# Patient Record
Sex: Female | Born: 1935 | Race: White | Hispanic: No
Health system: Southern US, Community
[De-identification: ages and names within clinical notes are randomized; demographics above are authoritative.]

---

## 2004-03-30 ENCOUNTER — Other Ambulatory Visit: Payer: Self-pay

## 2005-03-02 ENCOUNTER — Ambulatory Visit: Payer: Self-pay | Admitting: Internal Medicine

## 2006-03-22 ENCOUNTER — Ambulatory Visit: Payer: Self-pay | Admitting: Internal Medicine

## 2006-06-11 ENCOUNTER — Ambulatory Visit: Payer: Self-pay | Admitting: Ophthalmology

## 2006-09-18 ENCOUNTER — Emergency Department: Payer: Self-pay | Admitting: Emergency Medicine

## 2007-03-26 ENCOUNTER — Ambulatory Visit: Payer: Self-pay | Admitting: Internal Medicine

## 2007-04-18 ENCOUNTER — Other Ambulatory Visit: Payer: Self-pay

## 2007-04-18 ENCOUNTER — Emergency Department: Payer: Self-pay | Admitting: Emergency Medicine

## 2008-02-17 ENCOUNTER — Ambulatory Visit: Payer: Self-pay | Admitting: Internal Medicine

## 2008-02-18 ENCOUNTER — Ambulatory Visit: Payer: Self-pay | Admitting: Internal Medicine

## 2008-02-25 ENCOUNTER — Ambulatory Visit: Payer: Self-pay | Admitting: Internal Medicine

## 2008-03-18 ENCOUNTER — Ambulatory Visit: Payer: Self-pay | Admitting: Internal Medicine

## 2008-04-18 ENCOUNTER — Ambulatory Visit: Payer: Self-pay | Admitting: Internal Medicine

## 2008-05-19 ENCOUNTER — Ambulatory Visit: Payer: Self-pay | Admitting: Internal Medicine

## 2008-05-28 ENCOUNTER — Ambulatory Visit: Payer: Self-pay | Admitting: Internal Medicine

## 2008-06-18 ENCOUNTER — Ambulatory Visit: Payer: Self-pay | Admitting: Internal Medicine

## 2008-07-19 ENCOUNTER — Ambulatory Visit: Payer: Self-pay | Admitting: Internal Medicine

## 2008-08-18 ENCOUNTER — Ambulatory Visit: Payer: Self-pay | Admitting: Internal Medicine

## 2008-09-18 ENCOUNTER — Ambulatory Visit: Payer: Self-pay | Admitting: Internal Medicine

## 2008-10-19 ENCOUNTER — Ambulatory Visit: Payer: Self-pay | Admitting: Internal Medicine

## 2008-11-16 ENCOUNTER — Ambulatory Visit: Payer: Self-pay | Admitting: Internal Medicine

## 2008-12-17 ENCOUNTER — Ambulatory Visit: Payer: Self-pay | Admitting: Internal Medicine

## 2008-12-25 ENCOUNTER — Ambulatory Visit: Payer: Self-pay | Admitting: Internal Medicine

## 2009-01-16 ENCOUNTER — Ambulatory Visit: Payer: Self-pay | Admitting: Internal Medicine

## 2009-02-16 ENCOUNTER — Ambulatory Visit: Payer: Self-pay | Admitting: Internal Medicine

## 2009-03-18 ENCOUNTER — Ambulatory Visit: Payer: Self-pay | Admitting: Internal Medicine

## 2009-04-07 ENCOUNTER — Ambulatory Visit: Payer: Self-pay | Admitting: Internal Medicine

## 2009-04-18 ENCOUNTER — Ambulatory Visit: Payer: Self-pay | Admitting: Internal Medicine

## 2009-05-19 ENCOUNTER — Ambulatory Visit: Payer: Self-pay | Admitting: Internal Medicine

## 2009-06-18 ENCOUNTER — Ambulatory Visit: Payer: Self-pay | Admitting: Internal Medicine

## 2009-07-19 ENCOUNTER — Ambulatory Visit: Payer: Self-pay | Admitting: Internal Medicine

## 2009-08-18 ENCOUNTER — Ambulatory Visit: Payer: Self-pay | Admitting: Internal Medicine

## 2009-09-18 ENCOUNTER — Ambulatory Visit: Payer: Self-pay | Admitting: Internal Medicine

## 2009-10-12 ENCOUNTER — Ambulatory Visit: Payer: Self-pay | Admitting: Internal Medicine

## 2009-10-19 ENCOUNTER — Ambulatory Visit: Payer: Self-pay | Admitting: Internal Medicine

## 2009-11-16 ENCOUNTER — Ambulatory Visit: Payer: Self-pay | Admitting: Internal Medicine

## 2009-12-16 ENCOUNTER — Ambulatory Visit: Payer: Self-pay | Admitting: Internal Medicine

## 2009-12-17 ENCOUNTER — Ambulatory Visit: Payer: Self-pay | Admitting: Internal Medicine

## 2010-02-17 ENCOUNTER — Ambulatory Visit: Payer: Self-pay | Admitting: Internal Medicine

## 2010-03-18 ENCOUNTER — Ambulatory Visit: Payer: Self-pay | Admitting: Internal Medicine

## 2010-06-02 ENCOUNTER — Ambulatory Visit: Payer: Self-pay | Admitting: Internal Medicine

## 2010-06-18 ENCOUNTER — Ambulatory Visit: Payer: Self-pay | Admitting: Internal Medicine

## 2010-11-22 ENCOUNTER — Ambulatory Visit: Payer: Self-pay | Admitting: Internal Medicine

## 2010-12-18 ENCOUNTER — Ambulatory Visit: Payer: Self-pay | Admitting: Internal Medicine

## 2011-02-22 ENCOUNTER — Ambulatory Visit: Payer: Self-pay | Admitting: Internal Medicine

## 2011-03-19 ENCOUNTER — Ambulatory Visit: Payer: Self-pay | Admitting: Internal Medicine

## 2011-05-30 ENCOUNTER — Ambulatory Visit: Payer: Self-pay | Admitting: Internal Medicine

## 2011-06-19 ENCOUNTER — Ambulatory Visit: Payer: Self-pay | Admitting: Internal Medicine

## 2011-11-28 ENCOUNTER — Ambulatory Visit: Payer: Self-pay | Admitting: Oncology

## 2011-11-28 LAB — CBC CANCER CENTER
Basophil #: 0.1 x10 3/mm (ref 0.0–0.1)
Basophil %: 0.9 %
Eosinophil #: 0.5 x10 3/mm (ref 0.0–0.7)
HCT: 32.1 % — ABNORMAL LOW (ref 35.0–47.0)
MCH: 31.8 pg (ref 26.0–34.0)
MCHC: 34 g/dL (ref 32.0–36.0)
MCV: 94 fL (ref 80–100)
Monocyte #: 0.7 x10 3/mm (ref 0.0–0.7)
Monocyte %: 8.7 %
Neutrophil %: 62 %
Platelet: 294 x10 3/mm (ref 150–440)
RDW: 14 % (ref 11.5–14.5)
WBC: 7.5 x10 3/mm (ref 3.6–11.0)

## 2011-11-29 LAB — PROT IMMUNOELECTROPHORES(ARMC)

## 2011-12-18 ENCOUNTER — Ambulatory Visit: Payer: Self-pay | Admitting: Oncology

## 2013-02-16 ENCOUNTER — Ambulatory Visit: Payer: Self-pay | Admitting: Oncology

## 2013-03-03 LAB — CBC
HCT: 29.1 % — ABNORMAL LOW (ref 35.0–47.0)
HGB: 9.9 g/dL — ABNORMAL LOW (ref 12.0–16.0)
MCH: 32 pg (ref 26.0–34.0)
MCV: 94 fL (ref 80–100)
Platelet: 260 10*3/uL (ref 150–440)
RBC: 3.09 10*6/uL — ABNORMAL LOW (ref 3.80–5.20)
RDW: 14.2 % (ref 11.5–14.5)
WBC: 6.1 10*3/uL (ref 3.6–11.0)

## 2013-03-03 LAB — URINALYSIS, COMPLETE
Glucose,UR: NEGATIVE mg/dL (ref 0–75)
Hyaline Cast: 4
Nitrite: NEGATIVE
Ph: 5 (ref 4.5–8.0)
Protein: 30
RBC,UR: 4 /HPF (ref 0–5)
Specific Gravity: 1.016 (ref 1.003–1.030)
WBC UR: 1 /HPF (ref 0–5)

## 2013-03-03 LAB — COMPREHENSIVE METABOLIC PANEL
Bilirubin,Total: 0.6 mg/dL (ref 0.2–1.0)
Co2: 24 mmol/L (ref 21–32)
EGFR (African American): 17 — ABNORMAL LOW
EGFR (Non-African Amer.): 14 — ABNORMAL LOW
Glucose: 139 mg/dL — ABNORMAL HIGH (ref 65–99)
Osmolality: 285 (ref 275–301)
Potassium: 4.6 mmol/L (ref 3.5–5.1)
SGPT (ALT): 16 U/L (ref 12–78)
Total Protein: 6.8 g/dL (ref 6.4–8.2)

## 2013-03-03 LAB — CK TOTAL AND CKMB (NOT AT ARMC)
CK, Total: 124 U/L (ref 21–215)
CK-MB: 0.9 ng/mL (ref 0.5–3.6)

## 2013-03-03 LAB — TSH: Thyroid Stimulating Horm: 10.4 u[IU]/mL — ABNORMAL HIGH

## 2013-03-04 ENCOUNTER — Inpatient Hospital Stay: Payer: Self-pay | Admitting: Specialist

## 2013-03-04 LAB — IRON AND TIBC
Iron Bind.Cap.(Total): 186 ug/dL — ABNORMAL LOW (ref 250–450)
Iron Saturation: 34 %
Iron: 63 ug/dL (ref 50–170)

## 2013-03-04 LAB — CBC WITH DIFFERENTIAL/PLATELET
Basophil %: 1.8 %
Lymphocyte #: 1.3 10*3/uL (ref 1.0–3.6)
MCH: 31.5 pg (ref 26.0–34.0)
MCV: 93 fL (ref 80–100)
Monocyte %: 11.1 %
Neutrophil %: 56.8 %
Platelet: 223 10*3/uL (ref 150–440)
RDW: 13.8 % (ref 11.5–14.5)
WBC: 6 10*3/uL (ref 3.6–11.0)

## 2013-03-04 LAB — CK TOTAL AND CKMB (NOT AT ARMC)
CK, Total: 99 U/L (ref 21–215)
CK-MB: 1.2 ng/mL (ref 0.5–3.6)

## 2013-03-04 LAB — BASIC METABOLIC PANEL
Anion Gap: 4 — ABNORMAL LOW (ref 7–16)
Calcium, Total: 8.5 mg/dL (ref 8.5–10.1)
Chloride: 113 mmol/L — ABNORMAL HIGH (ref 98–107)
EGFR (African American): 20 — ABNORMAL LOW
Glucose: 81 mg/dL (ref 65–99)
Osmolality: 291 (ref 275–301)
Sodium: 143 mmol/L (ref 136–145)

## 2013-03-04 LAB — TROPONIN I: Troponin-I: <0.02 ng/mL

## 2013-03-04 LAB — TSH: Thyroid Stimulating Horm: 6.46 u[IU]/mL — ABNORMAL HIGH

## 2013-03-04 LAB — FERRITIN: Ferritin (ARMC): 280 ng/mL (ref 8–388)

## 2013-03-04 LAB — T4, FREE: Free Thyroxine: 1.11 ng/dL (ref 0.76–1.46)

## 2013-03-05 LAB — PHOSPHORUS: Phosphorus: 2.5 mg/dL (ref 2.5–4.9)

## 2013-03-05 LAB — CBC WITH DIFFERENTIAL/PLATELET
Basophil #: 0.1 10*3/uL (ref 0.0–0.1)
HCT: 23.9 % — ABNORMAL LOW (ref 35.0–47.0)
Lymphocyte #: 1.1 10*3/uL (ref 1.0–3.6)
MCHC: 34.2 g/dL (ref 32.0–36.0)
Platelet: 200 10*3/uL (ref 150–440)
RBC: 2.57 10*6/uL — ABNORMAL LOW (ref 3.80–5.20)
WBC: 7.5 10*3/uL (ref 3.6–11.0)

## 2013-03-05 LAB — BASIC METABOLIC PANEL
Anion Gap: 5 — ABNORMAL LOW (ref 7–16)
BUN: 30 mg/dL — ABNORMAL HIGH (ref 7–18)
Calcium, Total: 8 mg/dL — ABNORMAL LOW (ref 8.5–10.1)
Co2: 22 mmol/L (ref 21–32)
EGFR (African American): 28 — ABNORMAL LOW
EGFR (Non-African Amer.): 24 — ABNORMAL LOW
Glucose: 83 mg/dL (ref 65–99)
Osmolality: 294 (ref 275–301)
Potassium: 5.1 mmol/L (ref 3.5–5.1)
Sodium: 145 mmol/L (ref 136–145)

## 2013-03-05 LAB — CK: CK, Total: 107 U/L (ref 21–215)

## 2013-03-05 LAB — LIPID PANEL
HDL Cholesterol: 55 mg/dL (ref 40–60)
Ldl Cholesterol, Calc: 119 mg/dL — ABNORMAL HIGH (ref 0–100)
VLDL Cholesterol, Calc: 18 mg/dL (ref 5–40)

## 2013-03-05 LAB — HEMOGLOBIN A1C: Hemoglobin A1C: 4.9 % (ref 4.2–6.3)

## 2013-03-05 LAB — WBCS, STOOL

## 2013-03-05 LAB — OCCULT BLOOD X 1 CARD TO LAB, STOOL: Occult Blood, Feces: NEGATIVE

## 2013-03-05 LAB — MAGNESIUM: Magnesium: 2 mg/dL

## 2013-03-05 LAB — CLOSTRIDIUM DIFFICILE BY PCR

## 2013-03-06 LAB — PROTEIN ELECTROPHORESIS(ARMC)

## 2013-03-06 LAB — PROTEIN / CREATININE RATIO, URINE
Creatinine, Urine: 71.3 mg/dL (ref 30.0–125.0)
Protein, Random Urine: 12 mg/dL (ref 0–12)
Protein/Creat. Ratio: 168 mg/gCREAT (ref 0–200)

## 2013-03-06 LAB — BASIC METABOLIC PANEL
Anion Gap: 7 (ref 7–16)
Calcium, Total: 8.1 mg/dL — ABNORMAL LOW (ref 8.5–10.1)
Chloride: 117 mmol/L — ABNORMAL HIGH (ref 98–107)
Co2: 21 mmol/L (ref 21–32)
Creatinine: 1.7 mg/dL — ABNORMAL HIGH (ref 0.60–1.30)
Glucose: 85 mg/dL (ref 65–99)
Osmolality: 293 (ref 275–301)
Sodium: 145 mmol/L (ref 136–145)

## 2013-03-06 LAB — HEMOGLOBIN: HGB: 8.6 g/dL — ABNORMAL LOW (ref 12.0–16.0)

## 2013-03-07 LAB — STOOL CULTURE

## 2013-03-07 LAB — BASIC METABOLIC PANEL
BUN: 26 mg/dL — ABNORMAL HIGH (ref 7–18)
Calcium, Total: 8.3 mg/dL — ABNORMAL LOW (ref 8.5–10.1)
EGFR (African American): 35 — ABNORMAL LOW
Potassium: 4.5 mmol/L (ref 3.5–5.1)

## 2013-03-08 LAB — CBC WITH DIFFERENTIAL/PLATELET
Eosinophil #: 0.8 10*3/uL — ABNORMAL HIGH (ref 0.0–0.7)
Eosinophil %: 13.3 %
HCT: 24.8 % — ABNORMAL LOW (ref 35.0–47.0)
Lymphocyte %: 21.4 %
MCH: 31.1 pg (ref 26.0–34.0)
MCHC: 33.4 g/dL (ref 32.0–36.0)
MCV: 93 fL (ref 80–100)
Monocyte %: 12.7 %
Neutrophil #: 3.2 10*3/uL (ref 1.4–6.5)
Neutrophil %: 51.2 %
Platelet: 223 10*3/uL (ref 150–440)
RBC: 2.66 10*6/uL — ABNORMAL LOW (ref 3.80–5.20)
WBC: 6.2 10*3/uL (ref 3.6–11.0)

## 2013-03-08 LAB — BASIC METABOLIC PANEL
Anion Gap: 6 — ABNORMAL LOW (ref 7–16)
Calcium, Total: 8.1 mg/dL — ABNORMAL LOW (ref 8.5–10.1)
Chloride: 117 mmol/L — ABNORMAL HIGH (ref 98–107)
Co2: 21 mmol/L (ref 21–32)
EGFR (Non-African Amer.): 27 — ABNORMAL LOW
Glucose: 80 mg/dL (ref 65–99)
Potassium: 4.3 mmol/L (ref 3.5–5.1)

## 2013-03-09 LAB — BASIC METABOLIC PANEL
Anion Gap: 8 (ref 7–16)
Chloride: 117 mmol/L — ABNORMAL HIGH (ref 98–107)
Co2: 21 mmol/L (ref 21–32)
Creatinine: 1.7 mg/dL — ABNORMAL HIGH (ref 0.60–1.30)
EGFR (Non-African Amer.): 26 — ABNORMAL LOW
Glucose: 78 mg/dL (ref 65–99)
Osmolality: 293 (ref 275–301)
Potassium: 4.4 mmol/L (ref 3.5–5.1)

## 2013-03-10 LAB — BASIC METABOLIC PANEL
Anion Gap: 8 (ref 7–16)
BUN: 20 mg/dL — ABNORMAL HIGH (ref 7–18)
Co2: 21 mmol/L (ref 21–32)
Creatinine: 1.51 mg/dL — ABNORMAL HIGH (ref 0.60–1.30)
EGFR (African American): 35 — ABNORMAL LOW
EGFR (Non-African Amer.): 31 — ABNORMAL LOW
Osmolality: 290 (ref 275–301)
Sodium: 145 mmol/L (ref 136–145)

## 2013-03-18 ENCOUNTER — Ambulatory Visit: Payer: Self-pay | Admitting: Oncology

## 2013-03-29 ENCOUNTER — Emergency Department: Payer: Self-pay | Admitting: Emergency Medicine

## 2013-03-29 LAB — CBC
HCT: 34.1 % — ABNORMAL LOW (ref 35.0–47.0)
MCV: 96 fL (ref 80–100)
RBC: 3.54 10*6/uL — ABNORMAL LOW (ref 3.80–5.20)
RDW: 15.9 % — ABNORMAL HIGH (ref 11.5–14.5)

## 2013-03-29 LAB — COMPREHENSIVE METABOLIC PANEL
BUN: 33 mg/dL — ABNORMAL HIGH (ref 7–18)
Bilirubin,Total: 0.6 mg/dL (ref 0.2–1.0)
Calcium, Total: 9.1 mg/dL (ref 8.5–10.1)
Chloride: 107 mmol/L (ref 98–107)
Creatinine: 1.69 mg/dL — ABNORMAL HIGH (ref 0.60–1.30)
EGFR (African American): 31 — ABNORMAL LOW
EGFR (Non-African Amer.): 27 — ABNORMAL LOW
Osmolality: 284 (ref 275–301)
Potassium: 4.6 mmol/L (ref 3.5–5.1)
SGOT(AST): 28 U/L (ref 15–37)
SGPT (ALT): 23 U/L (ref 12–78)
Sodium: 139 mmol/L (ref 136–145)
Total Protein: 7 g/dL (ref 6.4–8.2)

## 2013-03-29 LAB — URINALYSIS, COMPLETE
Bilirubin,UR: NEGATIVE
Blood: NEGATIVE
Glucose,UR: NEGATIVE mg/dL (ref 0–75)
Ketone: NEGATIVE
Leukocyte Esterase: NEGATIVE
Ph: 6 (ref 4.5–8.0)
RBC,UR: <1 /HPF (ref 0–5)
Specific Gravity: 1.014 (ref 1.003–1.030)
Squamous Epithelial: NONE SEEN
WBC UR: <1 /HPF (ref 0–5)

## 2013-04-01 ENCOUNTER — Inpatient Hospital Stay: Payer: Self-pay | Admitting: Internal Medicine

## 2013-04-01 LAB — URINALYSIS, COMPLETE
Ketone: NEGATIVE
Leukocyte Esterase: NEGATIVE
Ph: 5 (ref 4.5–8.0)
Protein: 30
RBC,UR: <1 /HPF (ref 0–5)
Specific Gravity: 1.016 (ref 1.003–1.030)

## 2013-04-01 LAB — CBC
HCT: 33.9 % — ABNORMAL LOW (ref 35.0–47.0)
MCHC: 33.1 g/dL (ref 32.0–36.0)
MCV: 97 fL (ref 80–100)
RBC: 3.51 10*6/uL — ABNORMAL LOW (ref 3.80–5.20)
WBC: 8.1 10*3/uL (ref 3.6–11.0)

## 2013-04-01 LAB — COMPREHENSIVE METABOLIC PANEL
Alkaline Phosphatase: 169 U/L — ABNORMAL HIGH (ref 50–136)
Anion Gap: 11 (ref 7–16)
BUN: 37 mg/dL — ABNORMAL HIGH (ref 7–18)
Bilirubin,Total: 0.6 mg/dL (ref 0.2–1.0)
Chloride: 107 mmol/L (ref 98–107)
Creatinine: 1.71 mg/dL — ABNORMAL HIGH (ref 0.60–1.30)
EGFR (Non-African Amer.): 26 — ABNORMAL LOW
Osmolality: 290 (ref 275–301)
SGOT(AST): 42 U/L — ABNORMAL HIGH (ref 15–37)
SGPT (ALT): 35 U/L (ref 12–78)
Sodium: 141 mmol/L (ref 136–145)
Total Protein: 7.5 g/dL (ref 6.4–8.2)

## 2013-04-01 LAB — MAGNESIUM: Magnesium: 1.9 mg/dL

## 2013-04-01 LAB — CK TOTAL AND CKMB (NOT AT ARMC)
CK, Total: 85 U/L (ref 21–215)
CK-MB: <0.5 ng/mL — ABNORMAL LOW (ref 0.5–3.6)

## 2013-04-01 LAB — TROPONIN I: Troponin-I: <0.02 ng/mL

## 2013-04-01 LAB — T4, FREE: Free Thyroxine: 0.97 ng/dL (ref 0.76–1.46)

## 2013-04-02 LAB — COMPREHENSIVE METABOLIC PANEL
Alkaline Phosphatase: 138 U/L — ABNORMAL HIGH (ref 50–136)
BUN: 29 mg/dL — ABNORMAL HIGH (ref 7–18)
Bilirubin,Total: 0.5 mg/dL (ref 0.2–1.0)
Chloride: 113 mmol/L — ABNORMAL HIGH (ref 98–107)
Co2: 23 mmol/L (ref 21–32)
Creatinine: 1.43 mg/dL — ABNORMAL HIGH (ref 0.60–1.30)
EGFR (African American): 38 — ABNORMAL LOW
Osmolality: 288 (ref 275–301)
Potassium: 4 mmol/L (ref 3.5–5.1)
SGOT(AST): 24 U/L (ref 15–37)
SGPT (ALT): 25 U/L (ref 12–78)

## 2013-04-02 LAB — CBC WITH DIFFERENTIAL/PLATELET
Basophil #: 0.2 10*3/uL — ABNORMAL HIGH (ref 0.0–0.1)
Basophil %: 2.2 %
Eosinophil #: 1 10*3/uL — ABNORMAL HIGH (ref 0.0–0.7)
Eosinophil %: 13.4 %
HCT: 30.6 % — ABNORMAL LOW (ref 35.0–47.0)
HGB: 10.3 g/dL — ABNORMAL LOW (ref 12.0–16.0)
Lymphocyte #: 0.6 10*3/uL — ABNORMAL LOW (ref 1.0–3.6)
Lymphocyte %: 7.6 %
MCH: 32.4 pg (ref 26.0–34.0)
Monocyte #: 0.7 x10 3/mm (ref 0.2–0.9)
Neutrophil %: 67.6 %
RBC: 3.19 10*6/uL — ABNORMAL LOW (ref 3.80–5.20)
RDW: 15.6 % — ABNORMAL HIGH (ref 11.5–14.5)

## 2013-04-02 LAB — PROTIME-INR: INR: 1.1

## 2013-04-02 LAB — MAGNESIUM: Magnesium: 1.6 mg/dL — ABNORMAL LOW

## 2013-04-02 LAB — LIPASE, BLOOD: Lipase: 263 U/L (ref 73–393)

## 2013-04-06 LAB — CULTURE, BLOOD (SINGLE)

## 2013-04-18 ENCOUNTER — Ambulatory Visit: Payer: Self-pay | Admitting: Oncology

## 2013-04-29 DIAGNOSIS — S62109A Fracture of unspecified carpal bone, unspecified wrist, initial encounter for closed fracture: Secondary | ICD-10-CM

## 2013-04-29 DIAGNOSIS — F0150 Vascular dementia without behavioral disturbance: Secondary | ICD-10-CM

## 2013-04-29 DIAGNOSIS — F3289 Other specified depressive episodes: Secondary | ICD-10-CM

## 2013-04-29 DIAGNOSIS — F329 Major depressive disorder, single episode, unspecified: Secondary | ICD-10-CM

## 2013-04-29 DIAGNOSIS — F411 Generalized anxiety disorder: Secondary | ICD-10-CM

## 2013-05-14 DIAGNOSIS — F411 Generalized anxiety disorder: Secondary | ICD-10-CM

## 2013-05-19 ENCOUNTER — Ambulatory Visit: Payer: Self-pay | Admitting: Internal Medicine

## 2013-05-22 DIAGNOSIS — F411 Generalized anxiety disorder: Secondary | ICD-10-CM

## 2013-05-22 DIAGNOSIS — F0150 Vascular dementia without behavioral disturbance: Secondary | ICD-10-CM

## 2013-05-22 DIAGNOSIS — F323 Major depressive disorder, single episode, severe with psychotic features: Secondary | ICD-10-CM

## 2013-05-28 DIAGNOSIS — N184 Chronic kidney disease, stage 4 (severe): Secondary | ICD-10-CM

## 2013-05-28 DIAGNOSIS — I428 Other cardiomyopathies: Secondary | ICD-10-CM

## 2013-05-28 DIAGNOSIS — F0150 Vascular dementia without behavioral disturbance: Secondary | ICD-10-CM

## 2013-05-28 DIAGNOSIS — E43 Unspecified severe protein-calorie malnutrition: Secondary | ICD-10-CM

## 2013-05-29 LAB — URINALYSIS, COMPLETE
Bilirubin,UR: NEGATIVE
Blood: NEGATIVE
Glucose,UR: NEGATIVE mg/dL (ref 0–75)
Hyaline Cast: 3
Nitrite: NEGATIVE
Ph: 5 (ref 4.5–8.0)
Protein: 30
RBC,UR: 2 /HPF (ref 0–5)
Specific Gravity: 1.015 (ref 1.003–1.030)
Squamous Epithelial: NONE SEEN
WBC UR: <1 /HPF (ref 0–5)

## 2013-05-29 LAB — CBC
MCH: 32 pg (ref 26.0–34.0)
MCHC: 33.7 g/dL (ref 32.0–36.0)
MCV: 95 fL (ref 80–100)
Platelet: 246 10*3/uL (ref 150–440)
RBC: 2.93 10*6/uL — ABNORMAL LOW (ref 3.80–5.20)
WBC: 7.1 10*3/uL (ref 3.6–11.0)

## 2013-05-29 LAB — COMPREHENSIVE METABOLIC PANEL
Alkaline Phosphatase: 93 U/L (ref 50–136)
BUN: 54 mg/dL — ABNORMAL HIGH (ref 7–18)
Chloride: 107 mmol/L (ref 98–107)
Co2: 26 mmol/L (ref 21–32)
Creatinine: 1.53 mg/dL — ABNORMAL HIGH (ref 0.60–1.30)
EGFR (Non-African Amer.): 30 — ABNORMAL LOW
Glucose: 111 mg/dL — ABNORMAL HIGH (ref 65–99)
Osmolality: 291 (ref 275–301)
Potassium: 4.4 mmol/L (ref 3.5–5.1)
SGOT(AST): 24 U/L (ref 15–37)
SGPT (ALT): 21 U/L (ref 12–78)

## 2013-05-30 ENCOUNTER — Inpatient Hospital Stay: Payer: Self-pay | Admitting: Specialist

## 2013-05-30 ENCOUNTER — Telehealth: Payer: Self-pay | Admitting: Family Medicine

## 2013-05-30 LAB — CBC WITH DIFFERENTIAL/PLATELET
Basophil #: 0 10*3/uL (ref 0.0–0.1)
Basophil %: 0.6 %
Eosinophil #: 0 10*3/uL (ref 0.0–0.7)
Eosinophil %: 0.3 %
Eosinophil %: 0 %
HGB: 7.7 g/dL — ABNORMAL LOW (ref 12.0–16.0)
HGB: 8.8 g/dL — ABNORMAL LOW (ref 12.0–16.0)
Lymphocyte #: 0.5 10*3/uL — ABNORMAL LOW (ref 1.0–3.6)
MCH: 31.1 pg (ref 26.0–34.0)
MCHC: 33 g/dL (ref 32.0–36.0)
MCHC: 33.2 g/dL (ref 32.0–36.0)
MCV: 95 fL (ref 80–100)
MCV: 94 fL (ref 80–100)
Monocyte %: 7.1 %
Neutrophil #: 13 10*3/uL — ABNORMAL HIGH (ref 1.4–6.5)
Neutrophil %: 88.4 %
Platelet: 255 10*3/uL (ref 150–440)
Platelet: 176 10*3/uL (ref 150–440)
RBC: 2.44 10*6/uL — ABNORMAL LOW (ref 3.80–5.20)
RBC: 2.83 10*6/uL — ABNORMAL LOW (ref 3.80–5.20)
WBC: 15.9 10*3/uL — ABNORMAL HIGH (ref 3.6–11.0)
WBC: 14.5 10*3/uL — ABNORMAL HIGH (ref 3.6–11.0)

## 2013-05-30 LAB — BASIC METABOLIC PANEL
Anion Gap: 6 — ABNORMAL LOW (ref 7–16)
Co2: 25 mmol/L (ref 21–32)
EGFR (Non-African Amer.): 28 — ABNORMAL LOW
Glucose: 197 mg/dL — ABNORMAL HIGH (ref 65–99)
Osmolality: 298 (ref 275–301)
Potassium: 4.6 mmol/L (ref 3.5–5.1)
Sodium: 139 mmol/L (ref 136–145)

## 2013-05-30 LAB — PROTIME-INR
INR: 1.1
Prothrombin Time: 14.2 secs (ref 11.5–14.7)

## 2013-05-30 LAB — APTT: Activated PTT: 29.8 secs (ref 23.6–35.9)

## 2013-05-30 NOTE — Telephone Encounter (Signed)
Will await word from the hospital--sounds like a hip fracture

## 2013-05-30 NOTE — Telephone Encounter (Signed)
[facility] Triage Call Report Triage Record Num: [id number] Operator: Chevis Pretty Patient Name: Hannah Baker Call Date & Time: 05/29/2013 8:47:25PM Patient Phone: [phone] PCP: Patient Gender: Female PCP Fax : Patient DOB: [birth year] Practice Name: [facility] Reason for Call: Caller: Tom/RN Shona Simpson; PCP: Tillman Abide [facility]); CB#: [phone]; Calling emergently regarding Report Fall. Fall occurred 05/29/13 and patient fell to the ground on her left side. States the left hip appears abducted and patient cannot move it. Per falls protocol, advised 911; facility to call family. Info to office. Protocol(s) Used: Falls Recommended Outcome per Protocol: Activate EMS 911 Reason for Outcome: Immobile since injury Care Advice: ~ Do not give the patient anything to eat or drink. ~ IMMEDIATE ACTION Write down provider's name. List or place the following in a bag for transport with the patient: current prescription and/or nonprescription medications; alternative treatments, therapies and medications; and street drugs. ~ Spinal Immobilization: - If head, neck, or spinal injury or disease is known, or suspected, tell the patient not to move. - Do not move the person unless there is an immediate threat to their life. - If moving becomes absolutely necessary, immobilize head, neck and spine. - Then move the patient's head, neck and body as a single unit to prevent or not worsen spinal cord injury. - If vomiting, immobilize, then roll patient to side with head, neck and body as a single unit. Do not turn or move head or neck. ~ Control Bleeding: - Cover wound with a clean cloth and apply firm pressure to control bleeding. - If bleeding continues through dressing, add more material. - DO NOT remove old dressing. - Continue to apply direct pressure. ~ An adult should stay with the patient, preferably one trained in CPR. If the person is not trained in CPR, then  he or she should provide hands-only (compression-only) CPR as recommended by the American Heart Association. ~ ~ Position patient so blood or other drainage decreases the risk of aspiration. 05/29/2013 8:52:29PM Page 1 of 1 [id number]

## 2013-05-31 LAB — CBC WITH DIFFERENTIAL/PLATELET
Basophil #: 0 10*3/uL (ref 0.0–0.1)
Basophil %: 0.1 %
Eosinophil #: 0 10*3/uL (ref 0.0–0.7)
Eosinophil %: 0 %
HCT: 23.4 % — ABNORMAL LOW (ref 35.0–47.0)
HGB: 7.6 g/dL — ABNORMAL LOW (ref 12.0–16.0)
Lymphocyte #: 0.4 10*3/uL — ABNORMAL LOW (ref 1.0–3.6)
MCV: 95 fL (ref 80–100)
Monocyte #: 1 x10 3/mm — ABNORMAL HIGH (ref 0.2–0.9)
Monocyte %: 8 %
Neutrophil #: 11.1 10*3/uL — ABNORMAL HIGH (ref 1.4–6.5)
Platelet: 173 10*3/uL (ref 150–440)
RBC: 2.48 10*6/uL — ABNORMAL LOW (ref 3.80–5.20)
RDW: 15.1 % — ABNORMAL HIGH (ref 11.5–14.5)
WBC: 12.4 10*3/uL — ABNORMAL HIGH (ref 3.6–11.0)

## 2013-05-31 LAB — BASIC METABOLIC PANEL
Chloride: 116 mmol/L — ABNORMAL HIGH (ref 98–107)
Co2: 22 mmol/L (ref 21–32)
EGFR (African American): 21 — ABNORMAL LOW
EGFR (Non-African Amer.): 18 — ABNORMAL LOW
Glucose: 126 mg/dL — ABNORMAL HIGH (ref 65–99)
Osmolality: 305 (ref 275–301)
Sodium: 144 mmol/L (ref 136–145)

## 2013-06-01 LAB — CBC WITH DIFFERENTIAL/PLATELET
Basophil #: 0 10*3/uL (ref 0.0–0.1)
Basophil %: 0.2 %
Eosinophil #: 0 10*3/uL (ref 0.0–0.7)
Eosinophil %: 0.1 %
HCT: 25.1 % — ABNORMAL LOW (ref 35.0–47.0)
Lymphocyte #: 0.4 10*3/uL — ABNORMAL LOW (ref 1.0–3.6)
Lymphocyte %: 4.4 %
MCH: 31.5 pg (ref 26.0–34.0)
MCHC: 34.6 g/dL (ref 32.0–36.0)
MCV: 91 fL (ref 80–100)
Monocyte %: 9.1 %
Neutrophil #: 7.6 10*3/uL — ABNORMAL HIGH (ref 1.4–6.5)
Neutrophil %: 86.2 %
Platelet: 168 10*3/uL (ref 150–440)
RBC: 2.75 10*6/uL — ABNORMAL LOW (ref 3.80–5.20)
RDW: 15 % — ABNORMAL HIGH (ref 11.5–14.5)
WBC: 8.8 10*3/uL (ref 3.6–11.0)

## 2013-06-01 LAB — BASIC METABOLIC PANEL
Anion Gap: 7 (ref 7–16)
Calcium, Total: 7.7 mg/dL — ABNORMAL LOW (ref 8.5–10.1)
Chloride: 114 mmol/L — ABNORMAL HIGH (ref 98–107)
EGFR (Non-African Amer.): 18 — ABNORMAL LOW
Glucose: 93 mg/dL (ref 65–99)
Osmolality: 298 (ref 275–301)
Potassium: 4.6 mmol/L (ref 3.5–5.1)

## 2013-06-02 LAB — BASIC METABOLIC PANEL
BUN: 51 mg/dL — ABNORMAL HIGH (ref 7–18)
Chloride: 114 mmol/L — ABNORMAL HIGH (ref 98–107)
Co2: 21 mmol/L (ref 21–32)
Creatinine: 1.76 mg/dL — ABNORMAL HIGH (ref 0.60–1.30)
Glucose: 101 mg/dL — ABNORMAL HIGH (ref 65–99)
Osmolality: 295 (ref 275–301)
Sodium: 141 mmol/L (ref 136–145)

## 2013-06-02 LAB — CBC WITH DIFFERENTIAL/PLATELET
Basophil #: 0 10*3/uL (ref 0.0–0.1)
Basophil %: 0.2 %
Eosinophil #: 0.1 10*3/uL (ref 0.0–0.7)
Eosinophil %: 1.2 %
Lymphocyte #: 0.6 10*3/uL — ABNORMAL LOW (ref 1.0–3.6)
MCHC: 34.5 g/dL (ref 32.0–36.0)
Neutrophil #: 7 10*3/uL — ABNORMAL HIGH (ref 1.4–6.5)
Neutrophil %: 83.3 %
RBC: 2.65 10*6/uL — ABNORMAL LOW (ref 3.80–5.20)
RDW: 14.7 % — ABNORMAL HIGH (ref 11.5–14.5)

## 2013-06-03 DIAGNOSIS — F0150 Vascular dementia without behavioral disturbance: Secondary | ICD-10-CM

## 2013-06-03 DIAGNOSIS — S7290XA Unspecified fracture of unspecified femur, initial encounter for closed fracture: Secondary | ICD-10-CM

## 2013-06-18 ENCOUNTER — Ambulatory Visit: Payer: Self-pay | Admitting: Internal Medicine

## 2013-06-19 DIAGNOSIS — F411 Generalized anxiety disorder: Secondary | ICD-10-CM

## 2013-06-19 DIAGNOSIS — F329 Major depressive disorder, single episode, unspecified: Secondary | ICD-10-CM

## 2013-06-19 DIAGNOSIS — F0150 Vascular dementia without behavioral disturbance: Secondary | ICD-10-CM

## 2013-06-19 DIAGNOSIS — S7290XA Unspecified fracture of unspecified femur, initial encounter for closed fracture: Secondary | ICD-10-CM

## 2013-07-02 DIAGNOSIS — N184 Chronic kidney disease, stage 4 (severe): Secondary | ICD-10-CM

## 2013-07-02 DIAGNOSIS — R627 Adult failure to thrive: Secondary | ICD-10-CM

## 2013-07-02 DIAGNOSIS — F0150 Vascular dementia without behavioral disturbance: Secondary | ICD-10-CM

## 2013-07-02 DIAGNOSIS — I739 Peripheral vascular disease, unspecified: Secondary | ICD-10-CM

## 2013-07-22 DIAGNOSIS — N184 Chronic kidney disease, stage 4 (severe): Secondary | ICD-10-CM

## 2013-07-22 DIAGNOSIS — S7290XA Unspecified fracture of unspecified femur, initial encounter for closed fracture: Secondary | ICD-10-CM

## 2013-07-22 DIAGNOSIS — F411 Generalized anxiety disorder: Secondary | ICD-10-CM

## 2013-07-22 DIAGNOSIS — F0150 Vascular dementia without behavioral disturbance: Secondary | ICD-10-CM

## 2013-07-22 DIAGNOSIS — F329 Major depressive disorder, single episode, unspecified: Secondary | ICD-10-CM

## 2013-08-28 DIAGNOSIS — I739 Peripheral vascular disease, unspecified: Secondary | ICD-10-CM

## 2013-08-28 DIAGNOSIS — N184 Chronic kidney disease, stage 4 (severe): Secondary | ICD-10-CM

## 2013-08-28 DIAGNOSIS — R627 Adult failure to thrive: Secondary | ICD-10-CM

## 2013-08-28 DIAGNOSIS — F0150 Vascular dementia without behavioral disturbance: Secondary | ICD-10-CM

## 2013-11-03 DIAGNOSIS — J209 Acute bronchitis, unspecified: Secondary | ICD-10-CM

## 2013-11-20 DIAGNOSIS — F0150 Vascular dementia without behavioral disturbance: Secondary | ICD-10-CM

## 2013-11-20 DIAGNOSIS — F3289 Other specified depressive episodes: Secondary | ICD-10-CM

## 2013-11-20 DIAGNOSIS — F411 Generalized anxiety disorder: Secondary | ICD-10-CM

## 2013-11-20 DIAGNOSIS — N184 Chronic kidney disease, stage 4 (severe): Secondary | ICD-10-CM

## 2013-11-20 DIAGNOSIS — F329 Major depressive disorder, single episode, unspecified: Secondary | ICD-10-CM

## 2013-11-29 IMAGING — CR LEFT WRIST - 2 VIEW
1 series · 2 of 2 positions shown · non-contrast
Comparison: none

REASON FOR EXAM: fall x2
COMMENTS:

[Series 1: pa · 0.17mm/px · 2 of 2 slices shown]
[im 1/2]
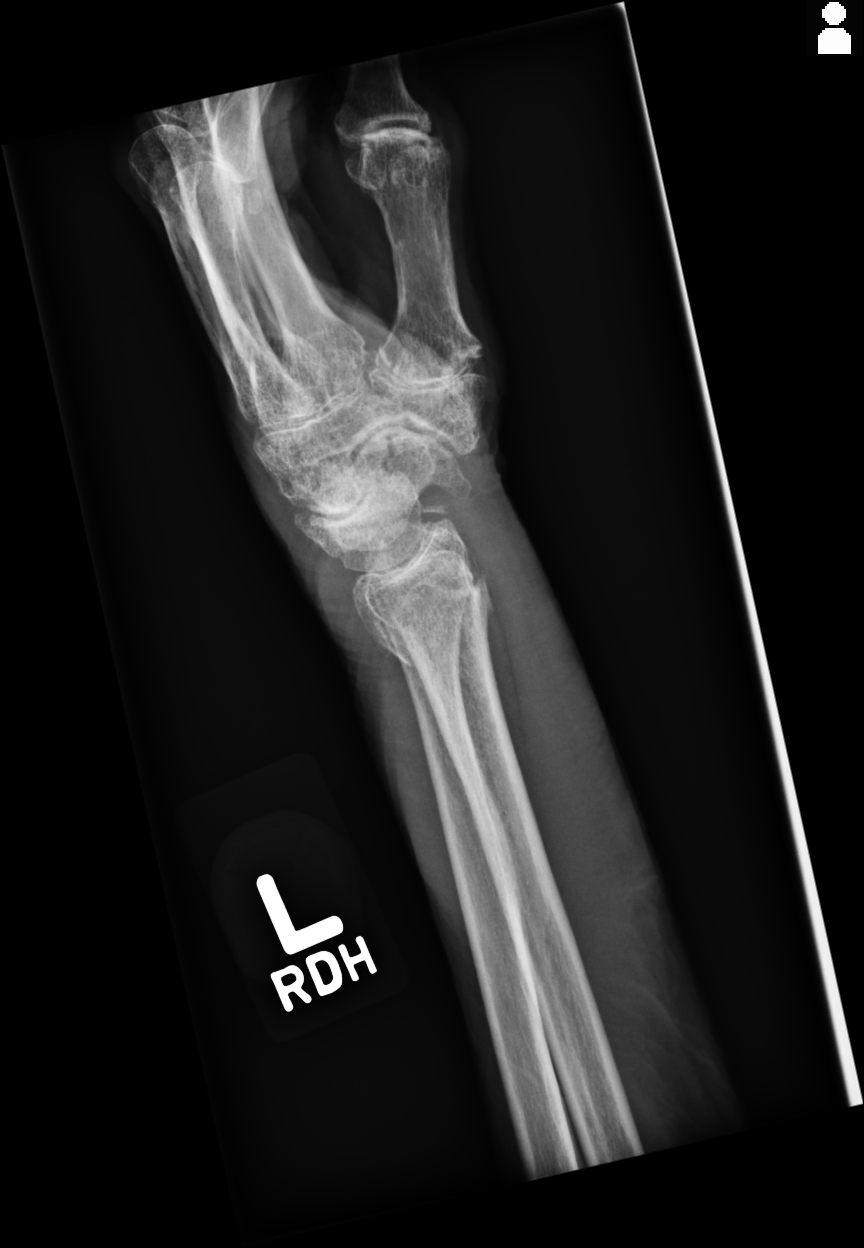
[im 2/2]
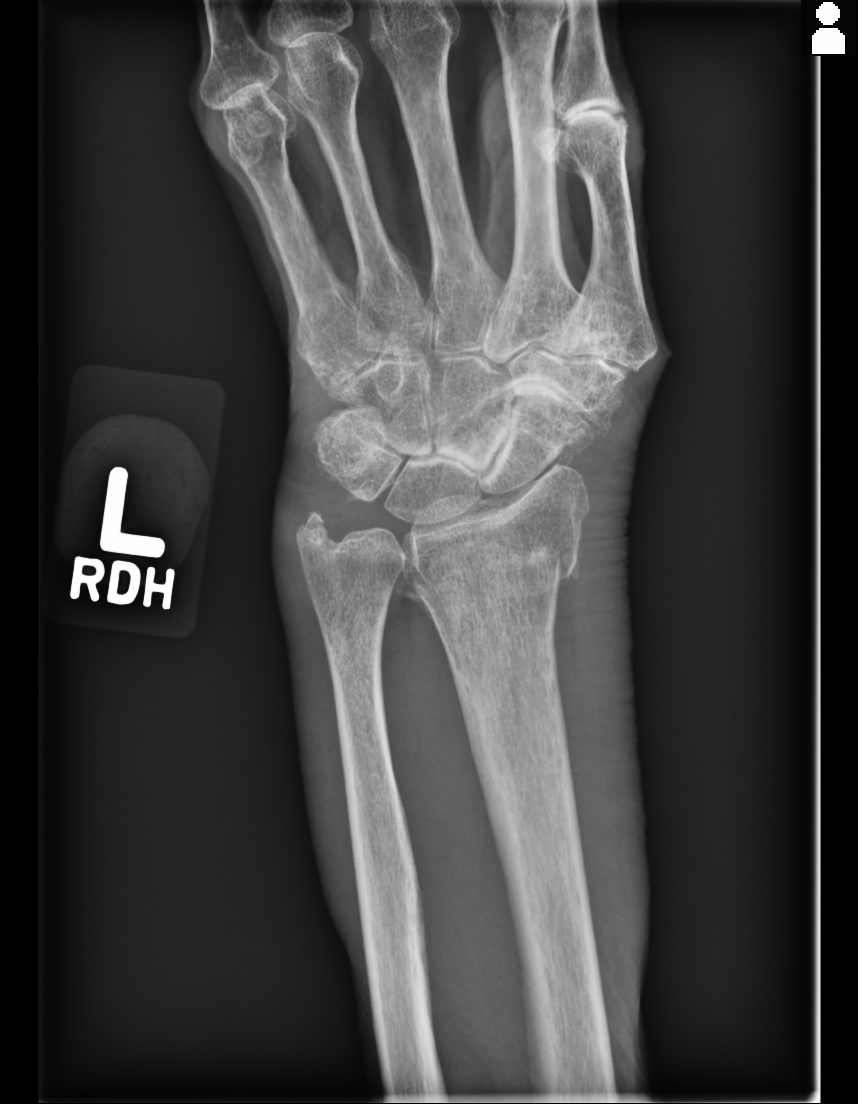

[2 of 2 positions shown; findings below may reference images not displayed]

PROCEDURE:     DXR - DXR WRIST LEFT AP AND LATERAL  - March 29, 2013  [DATE]

RESULT:     There is osteopenia. There is an impacted distal left radial
fracture with the fracture through the tip of the ulnar styloid process
without significant comminution, angulation or distraction in either
fracture. Moderately severe degenerative changes are seen in the intercarpal
and carpometacarpal regions especially in the first carpometacarpal joint
and second carpometacarpal joint. A definite carpal fractures not
appreciated.
IMPRESSION: Fractures of the distal left radius and ulnar styloid
process as described. DJD and osteopenia are present.

[REDACTED]

## 2013-12-02 IMAGING — CT CT CHEST-ABD-PELV W/O CM
1 of 2 series · 14 of 32 positions shown, 19 images · non-contrast
Comparison: None

REASON FOR EXAM: (1) cachexia, wt loss, r/o malignancy; (2) cachexia, wt
loss, r/o malignancy;
COMMENTS:

PROCEDURE:     CT  - CT CHEST ABDOMEN AND PELVIS WO  - April 01, 2013  [DATE]
RESULT:     CT CHEST, ABDOMEN, AND PELVIS
HISTORY: Cachectic, weight loss
TECHNIQUE: Multiple axial images obtained from the thoracic inlet to the
pubic symphysis, with p.o. contrast and without intravenous contrast.

[Series 2: soft tissue · axial · 0.62mm/px · z∈[-992,-452]mm · 14 of 206 slices shown, 19 images]
[im 13/206  mediastinal]
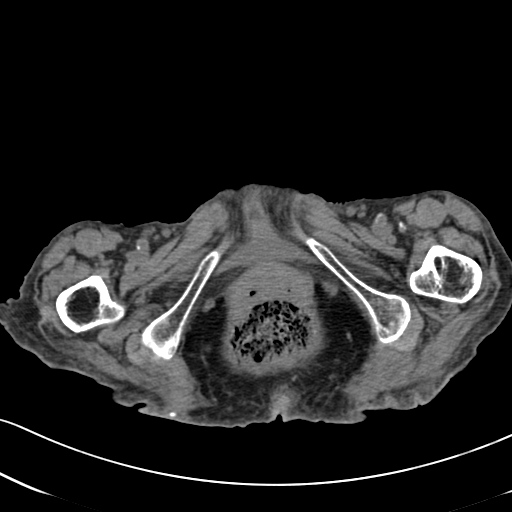
[im 13/206  bone]
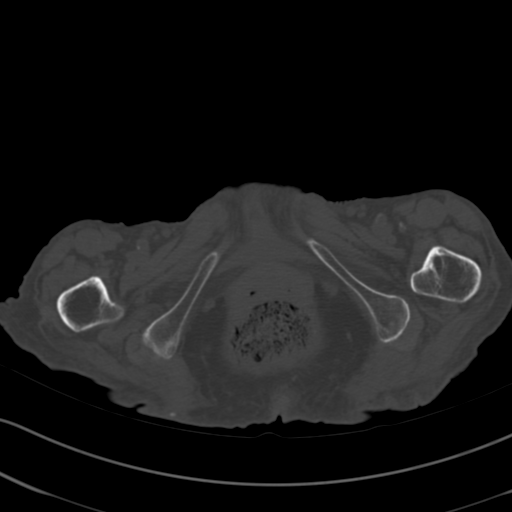
[im 37/206  mediastinal]
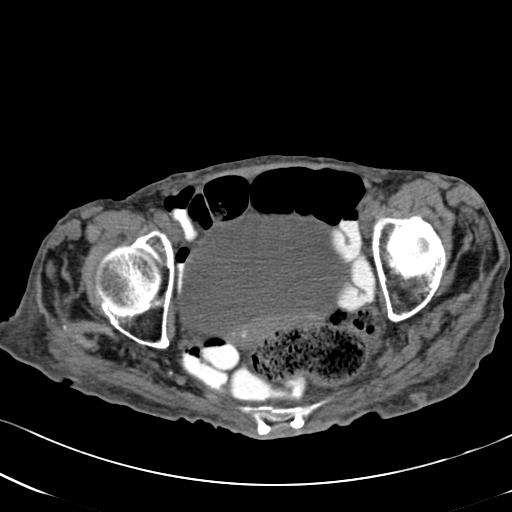
[im 49/206  mediastinal]
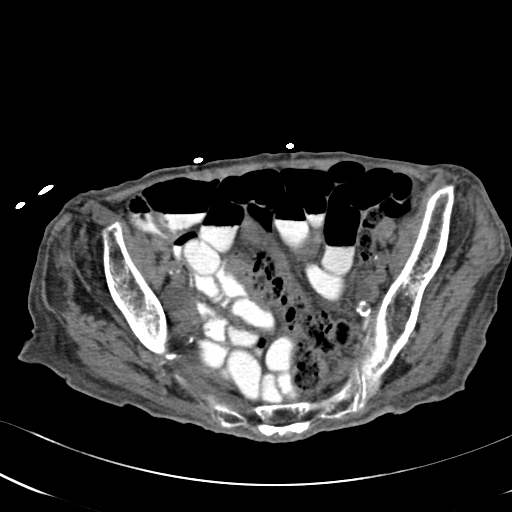
[im 69/206  mediastinal]
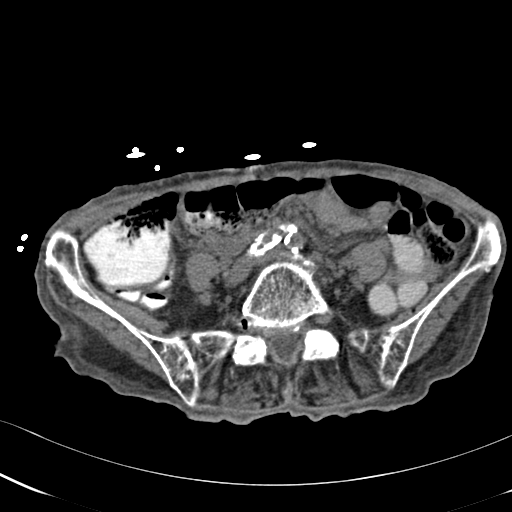
[im 73/206  mediastinal]
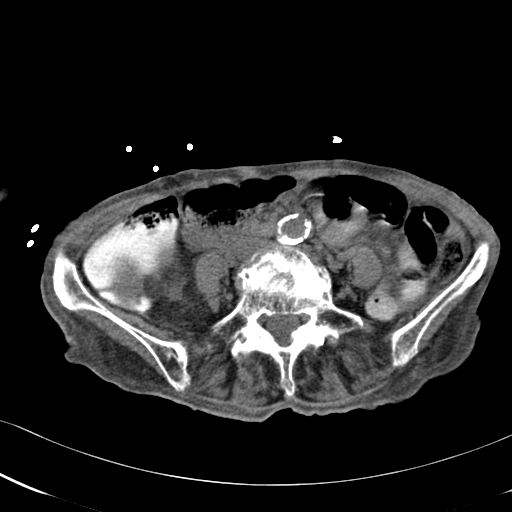
[im 97/206  mediastinal]
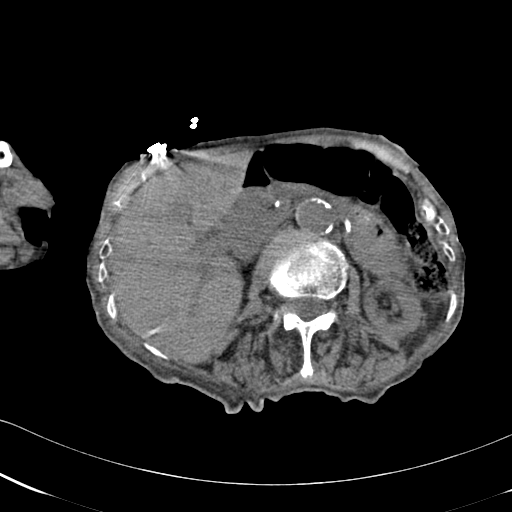
[im 101/206  mediastinal]
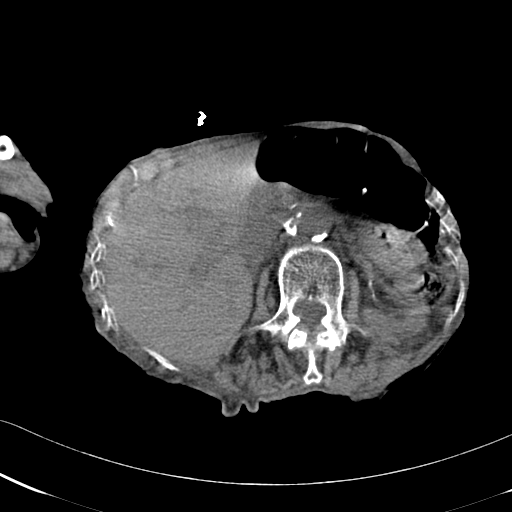
[im 109/206  mediastinal]
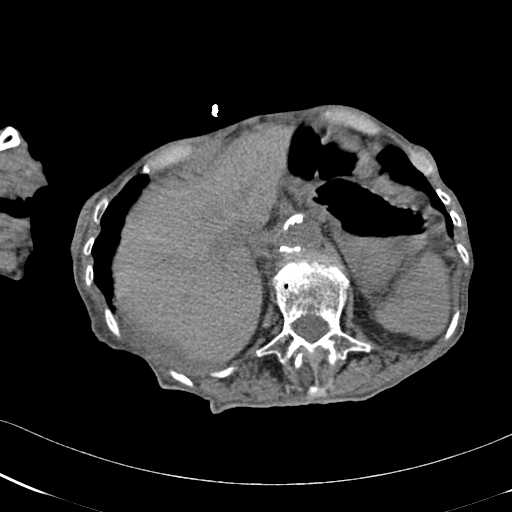
[im 133/206  mediastinal]
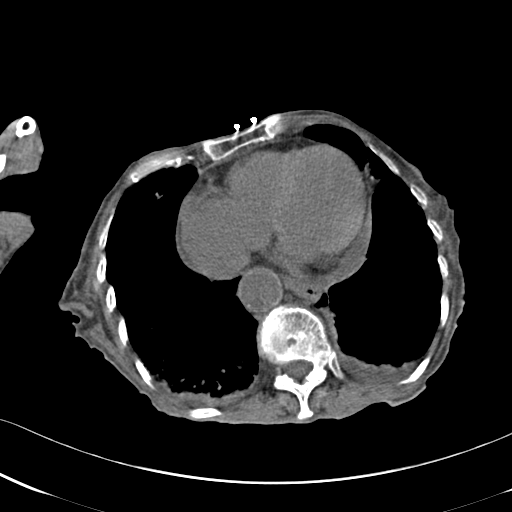
[im 133/206  bone]
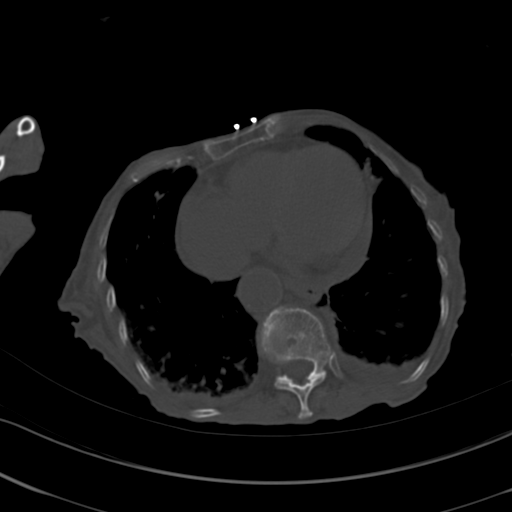
[im 137/206  mediastinal]
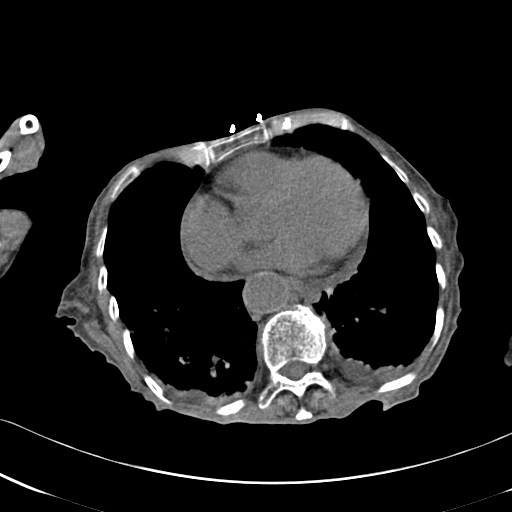
[im 157/206  mediastinal]
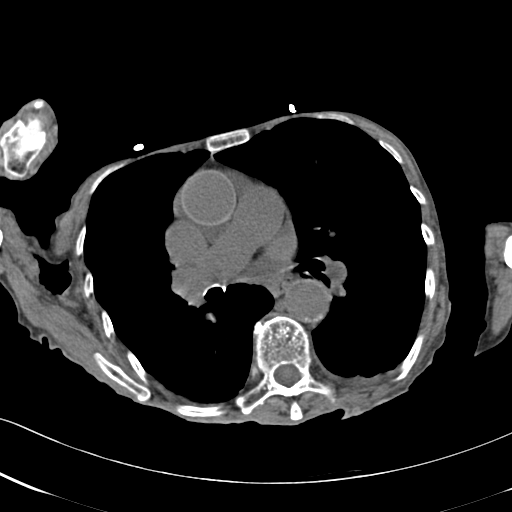
[im 157/206  lung]
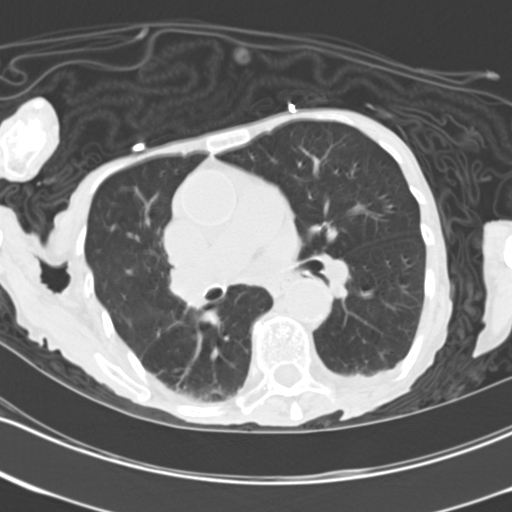
[im 169/206  mediastinal]
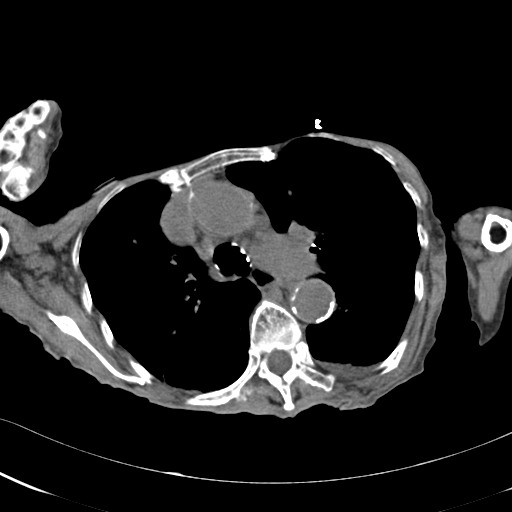
[im 169/206  lung]
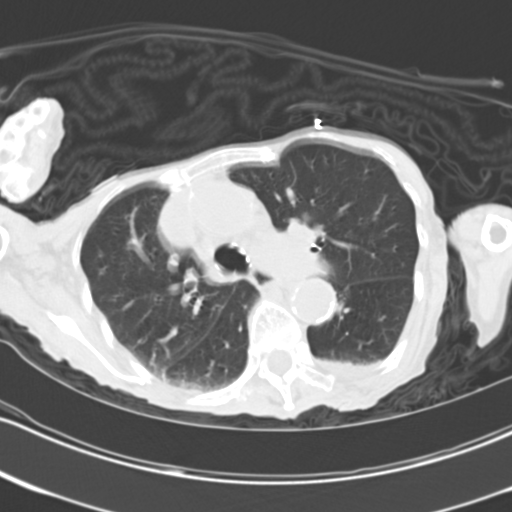
[im 181/206  lung]
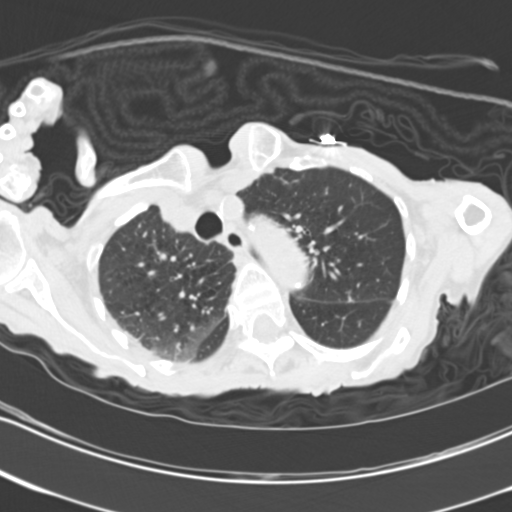
[im 193/206  mediastinal]
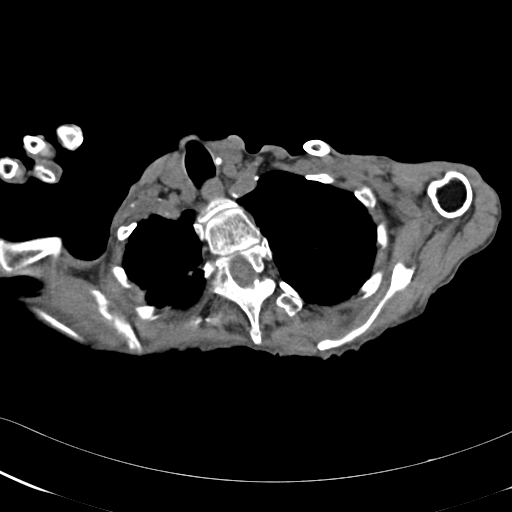
[im 193/206  lung]
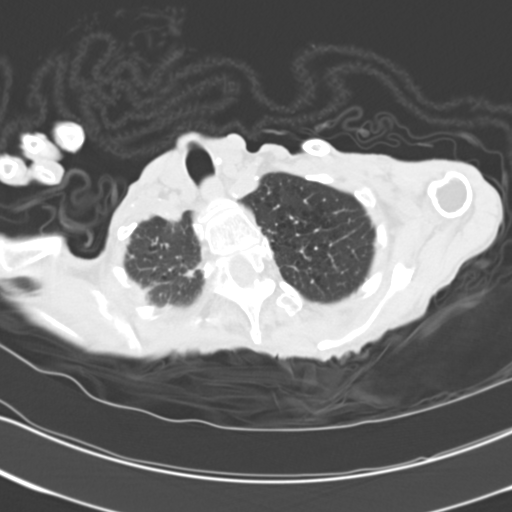

[14 of 32 positions shown; findings below may reference images not displayed]

FINDINGS: CHEST:

There are bilateral emphysematous changes. There are trace bilateral pleural
effusions. There is bibasilar airspace disease likely atelectasis.

The heart size is enlarged. There is a trace pericardial effusion. There is
coronary artery atherosclerosis.

There are no pathologically enlarged mediastinal, hilar, or axillary lymph
nodes.

The osseous structures demonstrate no focal abnormality.

ABDOMEN/PELVIS:

The liver demonstrates no focal abnormality. There is no intrahepatic or
extrahepatic biliary ductal dilatation. The gallbladder is unremarkable. The
spleen demonstrates no focal abnormality. The kidneys, adrenal glands,
pancreas are normal. The bladder is unremarkable.

The stomach, duodenum, small intestine, and large intestine demonstrate no
dilatation. There is rectal fecal impaction. There is no pneumoperitoneum,
pneumatosis, or portal venous gas. There is no abdominal or pelvic free
fluid. There is no lymphadenopathy.

The abdominal aorta is normal in caliber with atherosclerosis.

There is lumbar spine spondylosis. There is generalized osteopenia.
IMPRESSION: 1. No acute abnormality of the chest, abdomen or pelvis.
2. Bilateral trace pleural effusions.
3. Coronary artery disease.

[REDACTED]

## 2014-01-21 DIAGNOSIS — F411 Generalized anxiety disorder: Secondary | ICD-10-CM

## 2014-01-21 DIAGNOSIS — N184 Chronic kidney disease, stage 4 (severe): Secondary | ICD-10-CM

## 2014-01-21 DIAGNOSIS — K5900 Constipation, unspecified: Secondary | ICD-10-CM

## 2014-01-21 DIAGNOSIS — F329 Major depressive disorder, single episode, unspecified: Secondary | ICD-10-CM

## 2014-01-21 DIAGNOSIS — E039 Hypothyroidism, unspecified: Secondary | ICD-10-CM

## 2014-01-21 DIAGNOSIS — F3289 Other specified depressive episodes: Secondary | ICD-10-CM

## 2014-01-21 DIAGNOSIS — F0390 Unspecified dementia without behavioral disturbance: Secondary | ICD-10-CM

## 2014-03-31 DIAGNOSIS — F411 Generalized anxiety disorder: Secondary | ICD-10-CM

## 2014-03-31 DIAGNOSIS — F3289 Other specified depressive episodes: Secondary | ICD-10-CM

## 2014-03-31 DIAGNOSIS — F0150 Vascular dementia without behavioral disturbance: Secondary | ICD-10-CM

## 2014-03-31 DIAGNOSIS — E039 Hypothyroidism, unspecified: Secondary | ICD-10-CM

## 2014-03-31 DIAGNOSIS — F329 Major depressive disorder, single episode, unspecified: Secondary | ICD-10-CM

## 2014-04-03 DIAGNOSIS — R4020 Unspecified coma: Secondary | ICD-10-CM

## 2014-04-03 DIAGNOSIS — F411 Generalized anxiety disorder: Secondary | ICD-10-CM

## 2014-05-20 DIAGNOSIS — F0390 Unspecified dementia without behavioral disturbance: Secondary | ICD-10-CM

## 2014-05-20 DIAGNOSIS — K5900 Constipation, unspecified: Secondary | ICD-10-CM

## 2014-05-20 DIAGNOSIS — F329 Major depressive disorder, single episode, unspecified: Secondary | ICD-10-CM

## 2014-05-20 DIAGNOSIS — F411 Generalized anxiety disorder: Secondary | ICD-10-CM

## 2014-05-20 DIAGNOSIS — E039 Hypothyroidism, unspecified: Secondary | ICD-10-CM

## 2014-05-20 DIAGNOSIS — F3289 Other specified depressive episodes: Secondary | ICD-10-CM

## 2014-07-30 DIAGNOSIS — M150 Primary generalized (osteo)arthritis: Secondary | ICD-10-CM

## 2014-07-30 DIAGNOSIS — F39 Unspecified mood [affective] disorder: Secondary | ICD-10-CM

## 2014-07-30 DIAGNOSIS — R451 Restlessness and agitation: Secondary | ICD-10-CM

## 2014-07-30 DIAGNOSIS — F0151 Vascular dementia with behavioral disturbance: Secondary | ICD-10-CM

## 2014-09-23 DIAGNOSIS — R0600 Dyspnea, unspecified: Secondary | ICD-10-CM | POA: Diagnosis not present

## 2014-09-23 DIAGNOSIS — R4182 Altered mental status, unspecified: Secondary | ICD-10-CM | POA: Diagnosis not present

## 2014-09-23 DIAGNOSIS — R5383 Other fatigue: Secondary | ICD-10-CM | POA: Diagnosis not present

## 2014-09-24 ENCOUNTER — Telehealth: Payer: Self-pay | Admitting: Internal Medicine

## 2014-09-24 NOTE — Telephone Encounter (Signed)
Call from hospice that they are admitting this patient in active stages of dying. PCP is still Dr. Alphonsus SiasLetvak and given verbal order for ativan paste sublingual q 4 hr prn.

## 2014-09-25 ENCOUNTER — Telehealth: Payer: Self-pay

## 2014-10-19 NOTE — Telephone Encounter (Signed)
Topical, I'm sure--not sublingual This is fine

## 2014-10-19 NOTE — Telephone Encounter (Signed)
PLEASE NOTE: All timestamps contained within this report are represented as [place] Standard Time. CONFIDENTIALTY NOTICE: This fax transmission is intended only for the addressee. It contains information that is legally privileged, confidential or otherwise protected from use or disclosure. If you are not the intended recipient, you are strictly prohibited from reviewing, disclosing, copying using or disseminating any of this information or taking any action in reliance on or regarding this information. If you have received this fax in error, please notify us immediately by telephone so that we can arrange for its return to us. Phone: [phone], Toll-Free: [phone], Fax: [phone] Page: 1 of 1 Call Id: [id number] [facility] - Client TELEPHONE ADVICE RECORD [facility] Patient Name: Hannah Baker Gender: Female DOB: 02-09-1922 Age: [age] Y 5 M 9 D Return Phone Number: Address: City/State/Zip: [place] [facility] - [facility] - Night Physician Tillman AbideLetvak, Richard Contact Type Call Call Type Page Only Caller Name [facility]-[phone] Relationship To Patient Provider Is this call to report lab results? No Return Phone Number Unavailable Initial Comment 1st attempt; Caller states that she is admitting a new PT [facility] and is needing a medication for the PT. Caller can be reached at [phone] Nurse Assessment Guidelines Guideline Title Affirmed Question Affirmed Notes Nurse Date/Time Lamount Cohen(Eastern Time) Disp. Time Lamount Cohen(Eastern Time) Disposition Final User 09/24/2014 7:06:46 PM [facility] Paging Queue Cassell ClementBeeler, Carolyn 09/24/2014 7:20:17 PM Called On-Call Provider Lucretia RoersWood, Amy 09/24/2014 7:21:12 PM Page Completed Yes Lucretia RoersWood, Amy After Care Instructions Given Call Event Type User Date / Time Description Paging DoctorName DoctorPhone DateTime  Result/Outcome Notes Genella MechKollar, Elizabeth [phone] 09/24/2014 7:20:17 PM Called On Call Provider - Reached Genella MechKollar, Elizabeth 09/24/2014 7:20:49 PM Spoke with On Call - General Phoned on-call. Information provided. On call transferred to caller.

## 2014-10-19 NOTE — Telephone Encounter (Signed)
Rx approved

## 2014-10-19 DEATH — deceased

## 2015-01-08 NOTE — Op Note (Signed)
PATIENT NAME:  Hannah Baker, Katerra MR#:  [id number] DATE OF BIRTH:  Jan 17, 1922  DATE OF PROCEDURE:  05/30/2013  PREOPERATIVE DIAGNOSIS: Left comminuted intertrochanteric hip fracture.   POSTOPERATIVE DIAGNOSIS: Left comminuted intertrochanteric hip fracture.   PROCEDURE: Intramedullary fixation of left comminuted intertrochanteric hip fracture.   SURGEON: Juanell FairlyKevin Malai Lady, M.D.   ANESTHESIA: Spinal.  ESTIMATED BLOOD LOSS: 50 mL.   COMPLICATIONS: None.   IMPLANTS: Biomet Affixus nail 11 x 400 mm, 130 degree nail with 95 mm lag screw and 2 distal interlocking screws.   INDICATIONS FOR PROCEDURE: The patient is an 81 year old patient with dementia who is ambulatory at baseline. I had recommended operative fixation for the left hip for pain control. I discussed this with the patient's niece who is intimately involved in her care. The palliative care team headed by Dr. Harvie JuniorPhifer had also seen and evaluated the patient and agreed with fixation of the left hip for pain control.   I explained the risks and benefits of surgery with the patient's niece. She understood these risks and wished to proceed with surgery. She had discussed this also with her cousin, Kathlyn SacramentoGrey Bailey.   DESCRIPTION OF PROCEDURE: The patient had her left hip marked with the word "yes", according to the hospital's right site protocol. She was brought to the operating room where she underwent a spinal anesthetic. The patient was then positioned on the fracture table. She was supine. The left leg was placed in a leg holder for traction and reduction. Her right leg was placed in a hemi-lithotomy position and adequately padded. All bony prominences were adequately padded.  A timeout was performed to verify the patient's name, date of birth, medical record number, correct site of surgery and correct procedure to be performed. It was also used to verify that the patient had received antibiotics and that all appropriate instruments, implants and  radiographic studies were available in the room. Once all in attendance were in agreement, the case began.   A closed reduction was performed. This was performed with traction and slight internal rotation. The reduction of the fracture was confirmed on AP and lateral C-arm images. Once adequate reduction had been achieved, the patient was prepped and draped in a sterile fashion.   A timeout was performed to verify the patient's name, date of birth, medical record number, correct site of surgery and correct procedure to be performed. It was also used to verify that the patient had received antibiotics and that all appropriate instruments and radiographic studies were available in the room. Once all in attendance were in agreement, the case began.   An incision was made just superior to the tip of the greater trochanter. The deep fascia was opened with a deep #10 blade, and the fibers of the abductors were bluntly split in line with their fibers. This allowed for palpation of the tip of the greater trochanter. A drill guide pin was then inserted into the tip of the greater trochanter and advanced to just inferior to the lesser trochanter. This was then overdrilled with a 15 mm proximal drill to create the entry site for the nail. A ball-tip guidewire was then advanced through this proximal hole, across the fracture site and into the distal femur. The final position of the ball-tip guidewire was imaged at the knee. The length of the nail was then measured. It was felt that the 400 mm nail would be the appropriate length. The patient had "stove pipe knee" femoral canal and did not require any reaming.  An 11 x 400 mm Biomet Affixus nail was advanced through the insertion hole across the fracture site and into the distal femur. Its final position was confirmed on AP and lateral C-arm imaging, both proximally at the hip and distally at the knee. The lag screw drill guide was then placed through the insertion guide  arm. A small skin incision was made to allow for the drill guide to approximate the lateral cortex of the femur. A drill pin was then advanced through the lateral cortex across the fracture and into the femoral head until a tip apex distance of less than 50 mm was achieved. Again, the depth of this guidepin was measured to 95 mm in length. The lag screw drill was then advanced to a depth of 95 mm and a 95 mm lag screw was advanced into position by hand. Its position was confirmed on AP and lateral C-arm images. A set screw was then tightened at the top of the nail and backed off a quarter turn to allow for compression at the fracture site.   Finally, the attention was turned to placement of the distal interlocking screws. The C-arm was positioned to allow for perfect circle technique. A #10 blade was used to make the stab incisions distally. A drill for the distal interlocking screws was used using a freehand technique. Two distal interlocking screws were placed. Their depth was measured using a depth gauge. The lag screw lengths were 48 and 44 mm. Once both lag screws were placed, final C-arm images in both the AP and lateral projections were taken of the femur. The hardware was in good position. All wounds were copiously irrigated. The distal interlocking incisions were closed with a skin stapler. The two proximal incisions were closed using 0 Vicryl for the deep fascia and 2-0 Vicryl for the superficial fascia. The skin was closed with staples. Dry sterile dressings were applied. The patient was then transferred to a hospital bed in stable condition. She was brought to the PAC-U in stable condition. I spoke with the patient's niece in the postop waiting room to let her know the case had gone without complication and that her aunt was in stable condition.  ____________________________ Kathreen Devoid, MD klk:sb D: 06/03/2013 08:45:42 ET T: 06/03/2013 09:08:51 ET JOB#: [id number]  cc: Kathreen Devoid,  MD, <Dictator> Kathreen Devoid MD ELECTRONICALLY SIGNED 06/05/2013 12:28

## 2015-01-08 NOTE — Consult Note (Signed)
Chief Complaint:  Subjective/Chief Complaint Patient seen and examined, please see full GI consult.  Patient presenting with weight loss, diarrhea and dehydration.  Would recommend initial eval to be CT C/A/P before more focal luminal evaluation, however, patient declining both.  Follow clinically.   VITAL SIGNS/ANCILLARY NOTES: **Vital Signs.:   18-Jun-14 13:49  Vital Signs Type Routine  Temperature Temperature (F) 98.6  Celsius 37  Temperature Source oral  Pulse Pulse 67  Respirations Respirations 18  Systolic BP Systolic BP 035  Diastolic BP (mmHg) Diastolic BP (mmHg) 67  Mean BP 80  Pulse Ox % Pulse Ox % 97  Pulse Ox Activity Level  At rest  Oxygen Delivery Room Air/ 21 %  *Intake and Output.:   18-Jun-14 08:20  Unmeasured Intake  KEPT TRAY    12:15  Grand Totals Intake:   Output:      Net:   24 Hr.:  483  Unmeasured Intake  KEPT TRAY   Brief Assessment:  Cardiac Irregular   Respiratory clear BS   Gastrointestinal details normal Soft  Nontender  Nondistended  No masses palpable  Bowel sounds normal   Lab Results: LabObservation:  18-Jun-14 12:49   OBSERVATION Reason for Test  Cardiology:  18-Jun-14 12:49   Echo Doppler REASON FOR EXAM:     COMMENTS:     PROCEDURE: [place] - ECHO DOPPLER COMPLETE(TRANSTHOR)  - Mar 05 2013 12:49PM   RESULT: Echocardiogram Report  Patient Name:   TAELYN BROECKER Date of Exam: 03/05/2013 Medical Rec #:  [id number]       Custom1: Date of Birth:  12-04-1921    Height:       69.0 in Patient Age:    81 years     Weight:       79.0 lb Patient Gender: F            BSA:          1.39 m??  Indications: Atrial Fib Sonographer:    Sherrie Sport RDCS Referring Phys: Nicholes Mango  Summary:  1. Left ventricular ejection fraction, by visual estimation, is 45 to  50%.  2. Mildly decreased global left ventricular systolic function.  3. Moderately dilated left atrium.  4. Moderately dilated right atrium.  5. Moderate to severe mitral valve  regurgitation.  6. Moderate to severe tricuspid regurgitation. 2D AND M-MODE MEASUREMENTS (normal ranges within parentheses): Left Ventricle:          Normal IVSd (2D):      0.80 cm (0.7-1.1) LVPWd (2D):     1.02 cm (0.7-1.1) Aorta/LA:        Normal LVIDd (2D):     3.97 cm (3.4-5.7) Aortic Root (2D): 2.70 cm (2.4-3.7) LVIDs (2D):     2.59 cm           Left Atrium (2D): 3.10 cm (1.9-4.0) LV FS (2D):     34.8 %   (>25%) LV EF (2D):     64.6 %   (>50%)        Right Ventricle:                                   RVd (2D): LV DIASTOLIC FUNCTION: MV Peak E: 0.88 m/s E/e' Ratio: 9.20 MV Peak A: 0.72 m/s Decel Time: 275 msec E/A Ratio: 1.23 SPECTRAL DOPPLER ANALYSIS (where applicable): Mitral Valve: MV P1/2 Time: 79.75 msec MV Area, PHT: 2.76 cm?? Aortic Valve: AoV Max  Vel: 1.22 m/s AoV Peak PG: 6.0 mmHg AoV Mean PG: LVOT Vmax: 0.80 m/s LVOT VTI:  LVOT Diameter: 2.00 cm AoV Area, Vmax: 2.05 cm?? AoV Area, VTI:  AoV Area, Vmn: Tricuspid Valve and PA/RV Systolic Pressure: TR Max Velocity: 2.51 m/s RA  Pressure: 5 mmHg RVSP/PASP: 30.1 mmHg Pulmonic Valve: PV Max Velocity: 0.87 m/s PV Max PG: 3.0 mmHg PV Mean PG:  PHYSICIAN INTERPRETATION: Left Ventricle: The left ventricular internalcavity size was normal. LV  posterior wall thickness was normal. Global LV systolic function was  mildly decreased. Left ventricular ejection fraction, by visual  estimation, is 45 to 50%. Right Ventricle: Normal right ventricular size, wall thickness, and  systolic function. RV wall thickness is normal. Left Atrium: The left atrium is moderately dilated. Right Atrium: The right atrium is moderately dilated. Pericardium: There is no evidence of pericardial effusion. Mitral Valve: Moderate to severe mitral valve regurgitation is seen. Tricuspid Valve: Moderate to severe tricuspid regurgitation is  visualized. The tricuspid regurgitant velocity is 2.51 m/s, and with an  assumed right atrial pressure of 5 mmHg,  the estimated right ventricular  systolic pressure is normal at 30.1 mmHg. Aortic Valve: The aortic valve is trileaflet and structurally normal,  with normal leaflet excursion; without any evidence of aortic stenosis or  insufficiency. Pulmonic Valve: Structurally normal pulmonic valve, with normal leaflet  excursion. Aorta: The aortic root and ascending aorta are structurally normal, with  no evidence of dilitation.  [place] MD Electronically signed by [id number] Serafina Royals MD Signature Date/Time: 03/05/2013/5:35:11 PM  *** Final ***  IMPRESSION: .    Verified By: Corey Skains  ([facility]), M.D., MD  Routine Chem:  18-Jun-14 04:01   Phosphorus, Serum 2.5 (Result(s) reported on 05 Mar 2013 at 05:21AM.)  Uric Acid, Serum 5.8 (Result(s) reported on 05 Mar 2013 at 05:21AM.)  Glucose, Serum 83  BUN  30  Creatinine (comp)  1.83  Sodium, Serum 145  Potassium, Serum 5.1  Chloride, Serum  118  CO2, Serum 22  Calcium (Total), Serum  8.0  Anion Gap  5  Osmolality (calc) 294  eGFR (African American)  28  eGFR (Non-African American)  24 (eGFR values <21m/min/1.73 m2 may be an indication of chronic kidney disease (CKD). Calculated eGFR is useful in patients with stable renal function. The eGFR calculation will not be reliable in acutely ill patients when serum creatinine is changing rapidly. It is not useful in  patients on dialysis. The eGFR calculation may not be applicable to patients at the low and high extremes of body sizes, pregnant women, and vegetarians.)  Magnesium, Serum 2.0 (1.8-2.4 THERAPEUTIC RANGE: 4-7 mg/dL TOXIC: > 10 mg/dL  -----------------------)  Hemoglobin A1c ([facility]) 4.9 (The American Diabetes Association recommends that a primary goal of therapy should be <7% and that physicians should reevaluate the treatment regimen in patients with HbA1c values consistently >8%.)  Cholesterol, Serum 192  Triglycerides, Serum 88  HDL (INHOUSE) 55  VLDL  Cholesterol Calculated 18  LDL Cholesterol Calculated  119 (Result(s) reported on 05 Mar 2013 at 05:18AM.)  Cardiac:  18-Jun-14 04:01   CK, Total 107 (Result(s) reported on 05 Mar 2013 at 05:21AM.)  Routine Hem:  18-Jun-14 04:01   WBC (CBC) 7.5  RBC (CBC)  2.57  Hemoglobin (CBC)  8.2  Hematocrit (CBC)  23.9  Platelet Count (CBC) 200  MCV 93  MCH 31.8  MCHC 34.2  RDW 14.0  Neutrophil % 65.9  Lymphocyte % 14.2  Monocyte % 8.0  Eosinophil % 10.8  Basophil % 1.1  Neutrophil # 4.9  Lymphocyte # 1.1  Monocyte # 0.6  Eosinophil #  0.8  Basophil # 0.1 (Result(s) reported on 05 Mar 2013 at 05:16AM.)   Assessment/Plan:  Assessment/Plan:  Assessment as noted above.   Electronic Signatures: Loistine Simas (MD)  (Signed 18-Jun-14 18:25)  Authored: Chief Complaint, VITAL SIGNS/ANCILLARY NOTES, Brief Assessment, Lab Results, Assessment/Plan   Last Updated: 18-Jun-14 18:25 by Loistine Simas (MD)

## 2015-01-08 NOTE — Consult Note (Signed)
PATIENT NAME:  Hannah Baker, LOCASCIO MR#:  [id number] DATE OF BIRTH:  1921/12/13  DATE OF CONSULTATION:  03/05/2013  CONSULTING PHYSICIAN:  Hardie Shackleton. Colin Benton, PA-C  REFERRING PHYSICIAN: Katharina Caper, M.D.   REASON FOR CONSULTATION: Weight loss.   HISTORY OF PRESENT ILLNESS: This is a pleasant 81 year old female who was initially brought to the ER by her grandson who had concerns that the patient's was having significant amount of weight loss with failure to thrive as well as some diarrhea for the past 4 to 5 days. When speaking with the patient in the exam room, there is no family present and she tells me that her diarrhea has just started since being in the hospital. Per medical records, it has been going on for 4 to 5 days prior. Stool studies were obtained and thus far have been negative for C. difficile and negative stool guaiac. Stool culture is still currently pending. In regards to her weight loss, she states that she has lost 40 pounds in the past four months and states that it is because she is not eating as much as she used to. She states "food just does not taste good to me anymore." She has had three bowel movements so far today, but denies any black or bloody stools. White blood cells are within normal limits. Iron ferritin and B12 are all within normal limits as is folic acid; however, she is somewhat anemic. Last colonoscopy was in 2004 and notable for adenomatous colon polyps as well as diverticulosis and internal hemorrhoids. She admits she has not had a colonoscopy since that time 10 years ago. She also has a past medical history significant for Graves' disease and is status post radioactive ablation and is currently refusing any type of CT scan. She did have a somewhat altered mental status upon admission and it was advised to get a CT of the head; however, she refused. When speaking with the  patient, she is denying any chest pain or shortness of breath. There is no abdominal pain. There is no  dysphagia. Her only current complaint to me is diarrhea and chapped lips.   ALLERGIES: No known drug allergies.   PAST MEDICAL HISTORY: Graves' disease status post radioactive ablation, blood dyscrasias, [facility], valvular heart disease.   PAST SURGICAL HISTORY: None.   HOME MEDICATIONS: Multivitamin, iron sulfate, baby aspirin and Synthroid.   SOCIAL HISTORY: The patient does live alone. Negative for tobacco, alcohol or illicit drug use.   FAMILY HISTORY: Negative for GI malignancy, colon polyps or IBD.   REVIEW OF SYSTEMS: A 10 system review of systems was obtained on the patient. She does state that she gets occasional chest discomfort with deep respirations, due to a history of rib fracture. She is complaining of chapped lips and diarrhea. No other symptomatic concerns. A 10-system review was obtained. Pertinent positives are mentioned above and otherwise negative.   PHYSICAL EXAMINATION:   VITAL SIGNS: Blood pressure 106/67, heart rate 67, respirations 18, temperature 98.6, pulse 97%.  GENERAL: This is a pleasant 81 year old female resting quietly and comfortably in the exam room, in no acute distress. Alert and oriented x 3.  HEAD: Atraumatic, normocephalic.  NECK: Supple. No lymphadenopathy noted.  HEENT: Sclerae anicteric. Mucous membranes moist.  LUNGS: Respirations are even and unlabored. Clear to auscultation bilateral anterior lung fields.  CARDIAC: Regular rate and rhythm. S1, S2 noted.  ABDOMEN: Soft, nontender, nondistended. Normoactive bowel sounds noted in all four quadrants. No masses, hernias or organomegaly appreciated.  The patient has extremely thin habitus.  RECTAL: Deferred. Stool guaiac was negative.  EXTREMITIES: Negative for lower extremity edema, 2+ pulses noted bilaterally.  PSYCHIATRIC: Appropriate mood and affect. No signs of depression that I can tell.   LABORATORY, DIAGNOSTIC AND RADIOLOGIC DATA: White blood cells 7.5, hemoglobin 8.2,  hematocrit 23.9, platelets 200. Sodium 145, potassium 5.1, BUN 30, creatinine 1.83 glucose 83, folic acid 14.2, B12 normal. LFTs unrevealing, LDH 188, direct Coombs test is negative. Troponins were negative,  MCV 93. Lipid panel was unrevealing guaiac-negative. Ferritin 280. Iron binding capacity 186. Serum iron 63, iron saturation 34. TSH was 10.4, now 6.46, free T3/T4 are within normal limits.   A renal ultrasound is notable for bilateral nephrolithiasis with no signs of hydronephrosis. There  is also some debris in the bladder.   A chest x-ray was obtained on the patient showing borderline cardiomegaly, but no signs of an acute cardiopulmonary process.   ASSESSMENT: 1.  Abnormal weight loss 40 pounds over the past four months with anorexia and failure to thrive.  2.  Diarrhea for the past five days, described as loose and watery.  3.  Personal history of adenomatous colon polyps, last colonoscopy February 2004 by Dr. Lynnae Prudeobert Elliott.  4.  Chronic anemia.  5. History of Graves' disease status post radioactive ablation, currently with signs of hypothyroidism, with a markedly elevated TSH level.   PLAN: I have discussed this patient's case in detail with Dr. Barnetta ChapelMartin Skulskie who is involved in the development of the patient's plan of care. I had a long discussion with the patient regarding her current symptomatology and how profound her weight loss is. We have reviewed the results of the stool studies with C. difficile negative. However, the stool cultures are still pending and we will await these findings. Ideally we would like to proceed with a CT of the abdomen and pelvis for the indication of diarrhea and weight loss; however,  the patient is refusing this due to radiation. When I brought up the discussion of endoscopic intervention, she has also refused these procedures, including endoscopy and colonoscopy because she "does not want any invasive procedures because I don't think I am that sick." She  was found to have new onset atrial fibrillation, but anticoagulants were not recommended due to her fall risk. An echocardiogram is pending and we will await these findings as well. In regards to her profound weight loss and poor oral intake, we do stress the importance of caloric count x 3 days and awaiting dietitian consult for their recommendations. Ensure as tolerated. Antiemetics if necessary. We will continue to monitor this patient closely and be prepared for further intervention such as a CT scan or endoscopic intervention should the patient's clinical status change or should she change her mind regarding willingness to proceed. All questions were answered.   Thank you so much for this consultation and for allowing us to participate in the patient's plan of care.   ATTENDING GASTROENTEROLOGIST:  Dr. Barnetta ChapelMartin Skulskie.  ____________________________ Hardie ShackletonKaryn M. Katrisha Segall, PA-C kme:cc D: 03/05/2013 16:11:42 ET T: 03/05/2013 17:05:38 ET JOB#: [id number]  cc: Hardie ShackletonKaryn M. Lucine Bilski, PA-C, <Dictator> Hardie ShackletonKARYN M Catheleen Langhorne PA ELECTRONICALLY SIGNED 03/07/2013 8:25

## 2015-01-08 NOTE — Discharge Summary (Signed)
PATIENT NAME:  Marcella DubsFOWLER, Marya MR#:  [id number] DATE OF BIRTH:  1922-07-16  DATE OF ADMISSION:  05/30/2013 DATE OF DISCHARGE:  06/02/2013  For a detailed note, please take a look at the history and physical done on admission by Dr. Randol KernElgergawy.   DISCHARGE DIAGNOSIS: 1.  Status post fall and left hip fracture. 2.  Left hip fracture status post open reduction and internal fixation.  3.  Dementia.  4.  Hypothyroidism.  5.  Depression.  6.  Constipation.   DISCHARGE DIET: The patient is being discharged on a mechanical soft regular diet.   DISCHARGE ACTIVITY: As tolerated.   DISCHARGE FOLLOWUP: Is with the primary care physician at the skilled nursing facility.   DISPOSITION: The patient is being discharged to a skilled nursing facility for ongoing rehab given her recent hip surgery.   DISCHARGE MEDICATIONS:  1.  Synthroid 88 mcg daily. 2.  Lorazepam 0.5 mg 1/2 tab q. 4 hours as needed for anxiety. 3.  Risperdal 0.25 mg at bedtime. 4.  Celexa 10 mg 2 tabs daily. 5.  Aspirin 81 mg daily. 6.  Dulcolax suppository as needed. 7.  Ensure t.i.d. with meals. 8.  Calcium with vitamin D 1 tab b.i.d. 9.  Colace 240 mg daily. 10.  Iron sulfate 325 mg b.i.d. 11.  Senokot 1 tab b.i.d. as needed. 12.  Tylenol 650 q. 4 hours as needed. 13.  Tylenol with hydrocodone 5/325 mg 1 tab q. 4 to 6 hours as needed for pain.  CONSULTANTS DURING THE HOSPITAL COURSE: Dr. Juanell FairlyKevin Krasinski and Dr. Deeann SaintHoward Miller from orthopedics.   PERTINENT STUDIES DONE DURING THE HOSPITAL COURSE: Are as follows: An x-ray of the left hip done showing comminuted intertrochanteric left hip fracture. A chest x-ray done on admission showing COPD without evidence of acute cardiopulmonary disease.   HOSPITAL COURSE: This is an 81 year old female with medical problems as mentioned above who presented to the hospital on 05/30/2013 after suffering a fall and noted to have a left hip fracture.  1.  Status post fall and left hip fracture.  The patient was having significant pain in her left hip. She is not usually very ambulatory to begin with. After further discussion with the patient's POA and also family, it was decided that the patient would benefit from open reduction and internal fixation of the left hip for palliative reasons for pain control. The patient therefore was seen by orthopedics and underwent open reduction and internal fixation on 09/12 after being cleared from a medical standpoint. The patient tolerated the procedure well, although she has not been able to work with physical therapy very well while the hospital, but this was expected given her profound advanced dementia. At this point, the plan is to discharge her to a rehab facility to see if she would benefit from short-term rehab given her recent hip replacement, although if her clinical symptoms continue to deteriorate and she gets worse the next option is to transfer her to hospice home as she has been there before. This plan was discussed with the patient's POA and she is in agreement.  2.  Acute renal failure. This was likely secondary to dehydration and worsening anemia. The patient was given 1 unit of packed red blood cells. Also given some IV fluids. Her creatinine since then has improved and is close to baseline.  3.  Hyperkalemia. This was likely secondary to the acute renal failure. It has since then improved and now resolved.  4.  Dementia with agitation.  The patient's mental status has improved. At this point, she will continue her maintenance meds for her dementia including her Risperdal and her Celexa as stated.  5.  Hypothyroidism. The patient was maintained on her Synthroid. She will resume that.  6.  Constipation. This was likely related to the pain meds she has been receiving since her left hip surgery. At this point, the patient has not had a bowel movement, although the patient is getting laxatives and Dulcolax suppository in hoping that she would respond  to it. If she does, she likely will be discharged to a skilled nursing facility later today.   CODE STATUS: She is a DO NOT INTUBATE, DO NOT RESUSCITATE.   TIME SPENT WITH DISCHARGE: 40 minutes.  ____________________________ Rolly Pancake. Cherlynn Kaiser, MD vjs:sb D: 06/02/2013 14:43:00 ET T: 06/02/2013 14:53:22 ET JOB#: [id number]  cc: Rolly Pancake. Cherlynn Kaiser, MD, <Dictator> Houston Siren MD ELECTRONICALLY SIGNED 06/05/2013 17:24

## 2015-01-08 NOTE — Discharge Summary (Signed)
PATIENT NAME:  Hannah Baker, Hannah Baker MR#:  [id number] DATE OF BIRTH:  Nov 14, 1921  DATE OF ADMISSION:  03/04/2013 DATE OF DISCHARGE:  03/10/2013  For a detailed note, please take a look at the history and physical on admission by Dr. Amado Coe.   DIAGNOSES AT DISCHARGE: Acute-on-chronic renal failure; altered mental status, likely secondary to underlying dementia with underlying metabolic encephalopathy from dehydration, dementia, hypothyroidism, anxiety,  anemia of chronic disease.   The patient is being discharged on a regular diet.   ACTIVITY: As tolerated.   FOLLOWUP: The patient needs to get himself a primary care physician. She used to follow up with [facility].   DISCHARGE MEDICATIONS: Aspirin 81 mg daily, Synthroid 75 mcg daily, Marinol 2.5 mg b.i.d., ranitidine 150 mg b.i.d.   Consultants During The Hospital Course: Dr. Mosetta Pigeon and Dr. Wynelle Link from nephrology, Dr. Arnoldo Hooker from cardiology, Dr. Orlie Dakin from hematology/oncology.   PERTINENT STUDIES DONE DURING THE HOSPITAL COURSE: A chest x-ray done on June 16th showing no acute cardiopulmonary disease.   An ultrasound of the kidneys showing no hydronephrosis.   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 No acute abnormality. Small-vessel white matter ischemic changes.   HOSPITAL COURSE: This is an 81 year old female with medical problems as mentioned above, presented to the hospital on 03/04/2013 secondary to altered mental status and confusion, also failure to thrive.  1.  Acute-on-chronic renal failure: The patient presented to the hospital with failure to thrive and poor p.o. intake and dehydration. The patient presented with a creatinine as high as 2.8. Her baseline creatinines are around 1.4 to 1.5. The patient was hydrated with IV fluids, and her p.o. intake has significantly improved  since admission. Her creatinine is now back down to baseline. The patient was seen by nephrology by Dr. Thedore Mins and Dr. Wynelle Link, and agreed with the management. She had a renal ultrasound done which showed no hydronephrosis. The patient will continue follow-up of her kidney function as an outpatient with Dr. Wynelle Link.  2.  Dehydration: This was likely secondary to poor p.o. intake and ongoing diarrhea. The patient was hydrated with IV fluids, and her clinical symptoms have significantly improved. There was a thought of doing a CT scan of the abdomen and also possible endoscopy, but the patient refused it. She has been started on some Marinol as an appetite stimulant which seems to have helped. Her appetite has improved. She will continue Ensure supplements and Marinol as stated.  3.  Acute diarrhea: This is probably what led to the patient's dehydration and poor p.o. intake. Her diarrhea symptoms have resolved. Her stool cultures and C. diff. are negative, and, her diarrhea, as mentioned, has clinically resolved.   4.  Atrial fibrillation: This was paroxysmal in nature. She had an echocardiogram done which showed an EF of 45% to 50%. Her rates have been fairly well-controlled. Therefore she is currently not on rate-controlling meds. She is not on anticoagulants as she is a high fall risk, given her dementia,  5.  Altered mental status: This was likely secondary to her underlying dementia with some acute delirium related to her acute renal failure. Her mental status has now come back to baseline. She had an extensive workup, including CT head and MRI of the brain which showed no acute abnormalities, but a previous old CVA. The patient is significantly weak, is a high fall risk. Was evaluated  by physical therapy and thought she would benefit from short-term rehab, which is where she is currently being discharged.  6.  Hypothyroidism: The patient was maintained on her Synthroid. She will resume that.  7. Anemia of  chronic disease, and monoclonal gammopathy of unknown significance: The patient is followed by Dr. Orlie DakinFinnegan. She has received some Epogen and will continue follow with Dr. Orlie DakinFinnegan as an outpatient.   CODE STATUS: THE PATIENT IS A FULL CODE.   She is being discharged to a skilled nursing facility.   TIME SPENT: Forty minutes.    ____________________________ Hannah PancakeVivek J. Cherlynn KaiserSainani, MD vjs:dm D: 03/10/2013 12:08:00 ET T: 03/10/2013 12:26:25 ET JOB#: [id number]  cc: Hannah PancakeVivek J. Cherlynn KaiserSainani, MD, <Dictator> [facility] [place] SirenVIVEK J SAINANI MD ELECTRONICALLY SIGNED 03/21/2013 15:03

## 2015-01-08 NOTE — H&P (Signed)
PATIENT NAME:  Hannah Baker, Hannah Baker MR#:  [id number] DATE OF BIRTH:  01-16-1922  DATE OF ADMISSION:  03/04/2013  PRIMARY CARE PHYSICIAN: The patient sees Dr. Orlie Dakin.   CHIEF COMPLAINT: Altered mental status, diarrhea and failure to thrive.   HISTORY OF PRESENT ILLNESS: The patient is an 81 year old Caucasian female with a past medical history of hyperactive thyroid status post radioactive ablation and currently on Synthroid, heart valve problem and blood dyscrasias regarding which the patient follows with Dr. Orlie Dakin as an outpatient. Was brought into the ER by her grandson and his girlfriend for altered mental status. According to the patient's grandson, the patient lives alone and usually takes care of her health and she was in her usual state of mentation until 3 weeks ago. For the past 3 weeks, her mental situation is getting worse and also she is not eating and drinking much. She is having some cognitive difficulties, and her mentation is fluctuating a lot. The patient is brought into the ER, and the ER physician tried to send her for CAT scan of the head but the patient has refused CAT scan, stating that she does not want more radiation as she had radioactive iodine ablation of her thyroid in the past. ER physician had to cancel CAT scan of the head as the patient is refusing that. Family is reporting that the patient has been having diarrhea for the past 4 to 5 days, and it is worse today. Her BUN is elevated, and creatinine is at 2.8. TSH is elevated at 10.4. The patient weighs only 98 pounds. Family members are also reporting that the patient is falling frequently, and she had multiple rib fractures in the past. She has some blood disorder regarding which she follows with Dr. Orlie Dakin, and she was recently seen by him. The patient is having some intermittent talk but which is quite noncomprehensive during my examination. She talks about Ms.Clinton and then suddenly jumps and talks about magazines and  pets. The patient is all over the place and seemed to be quite confused. Lucila Maine is reporting that this is her normal baseline and concerned about her. In the ER, EKG has revealed new onset atrial fibrillation at 90 beats per minute.   PAST MEDICAL HISTORY: Thyroid disorder, heart valve abnormality, blood disorder, history of rib fracture.   PAST SURGICAL HISTORY: None.   ALLERGIES: No known drug allergies.   PSYCHOSOCIAL HISTORY: Lives alone. No history of smoking, alcohol or illicit drug usage. Grandson lives close by and takes care of her chores and groceries.    FAMILY HISTORY: None according to grandson.   HOME MEDICATIONS: Synthroid 75 mcg once daily, multivitamin once daily, iron sulfate 1 tablet p.o. once daily, aspirin 81 mg once daily, Artificial Tears every 4 hours.   REVIEW OF SYSTEMS: Unobtainable as the patient is with altered mentation.   PHYSICAL EXAMINATION:  VITAL SIGNS: Temperature 97.8, pulse 74, respirations 20, blood pressure 151/88, pulse ox 97%.  GENERAL APPEARANCE: Not in any acute distress, cachectic female who is quite emaciated.  HEENT: Normocephalic, atraumatic. Pupils are equal and reacting to light and accommodation. No scleral icterus. No conjunctival injection. No sinus tenderness. No postnasal drip.  NECK: Supple. No JVD. No thyromegaly.  LUNGS: Clear to auscultation bilaterally. No accessory muscle usage. No anterior chest wall tenderness on palpation.  CARDIOVASCULAR: Irregularly irregular. Positive murmur.  GASTROINTESTINAL: Soft. Bowel sounds are positive in all 4 quadrants. Nontender, nondistended. No masses felt. No hepatosplenomegaly.  NEUROLOGIC: Awake and alert, oriented to time  and person but disoriented to place. The patient is having difficulty in following verbal commands, so cranial nerves, motor and sensory could not be elicited. Reflexes are 2+.  EXTREMITIES: No edema. No cyanosis. No clubbing.  MUSCULOSKELETAL: No joint effusion,  tenderness or erythema.  SKIN: Warm to touch, dry. No rashes. No lesions. On examination of the palm, her thenar eminence is lost and all tendons are visible on examination of the hand.   LABS AND IMAGING STUDIES: The patient's Accu-Chek is 123. Glucose 139, BUN 32, sodium 138, potassium 4.6, chloride 107, CO2 24. GFR is 14. Anion gap 7. Serum osmolality 285. Calcium 9.0. Total protein 6.8, albumin 3.9, total bili is 0.6, alkaline phosphatase 42, ALT 16, AST 26. First set of cardiac enzymes are normal. TSH is elevated at 10.4. WBC 6.1, hemoglobin 9.9, hematocrit 29.1, platelets 260. Urinalysis yellow in color, clear in appearance. Glucose, bili, ketones are negative. Specific gravity 1.016. Nitrites and leukocyte esterase are negative.   ASSESSMENT AND PLAN: An 81 year old Caucasian female brought into the ER by her grandson for altered mental status, diarrhea, significant weight loss. Will be admitted with the following assessment and plan:  1. Altered mental status, probably from dehydration and hypothyroidism with elevated TSH. The patient has refused to get CAT scan of the head as per the ER physician. Will obtain neuro checks and will increase Synthroid dose. Will provide her aggressive hydration with intravenous fluids.  2. Dehydration and acute renal insufficiency, most likely from diarrhea. Will get stool test. Will provide intravenous fluids. Will obtain a renal ultrasound and avoid nephrotoxins. The patient will be on enteric precautions, and renal ultrasound is ordered.  3. History of hypothyroidism, status post radioactive iodine ablation in the past. Now with elevated TSH. Will increase her Synthroid dose to 88 mcg from 75. Endocrinology consultation is also placed.  4. Failure to thrive: Will put a consult to dietitian. Daily weights. Will obtain calorie counts.  5. Paroxysmal atrial fibrillation, rate controlled. With previous history of frequent falls, the patient being living alone and  being elderly, will just continue her home medication aspirin 81 mg. As the patient has a chronic history of some valvular abnormality and recently not seen by any doctor for the past several years, will consider cardiology consult as well.  6. Frequent falls: Will obtain physical therapy consult to check her gait.  7. History of blood dyscrasias. The patient was recently seen by Dr. Orlie DakinFinnegan. Will consult Dr. Orlie DakinFinnegan as well. 8. Will provide gastrointestinal and deep vein thrombosis prophylaxis. Pharmacy to dose the heparin as the patient is underweight with a BMI of 14.5.   She is FULL CODE.   The grandson is the power of attorney.   TOTAL TIME SPENT ON THE ADMISSION: 50 minutes.   The plan of care was discussed in detail with the Grandson at bed side. He is aware of the plan.   ____________________________ Ramonita LabAruna Mikia Delaluz, MD ag:gb D: 03/04/2013 00:51:05 ET T: 03/04/2013 01:37:42 ET JOB#: [id number]  cc: Ramonita LabAruna Carrieann Spielberg, MD, <Dictator> Ramonita LabARUNA Janah Mcculloh MD ELECTRONICALLY SIGNED 03/05/2013 6:47

## 2015-01-08 NOTE — Consult Note (Signed)
Brief Consult Note: Diagnosis: failure to thrive, weight loss, diarrhea.   Consult note dictated.   Discussed with Attending MD.   Comments: Patient seen and examined. She is alone in her room without family to speak with. She states she has lost 40lbs in 4 months due to lack of PO intake. Food "just doesnt taste the same" to her anymore. Patient states diarrhea started since being in the hospital, per Hospitalist note this has been goin on 4-5 days prior to admission. Denies abdominal pain, nausea,vomiting, dysphagia. Complaining of chapped lips. Hx of graves disease s/p radioactive ablation. Currently on synthroid with an  elevated TSH level.  She is refusing CT scan of head and abdomen because she does not want any radation. Patient is also declining endoscopic intervention and states, "I dont want any invasive procedure because i dont think im that sick".  Last colonoscopy 2004 notable for adenomatous colon polyps. Agree with IVF, stool cxs still pending but cdiff and guiaic negative. WBC wnl. Appreciate dietary input, agree with ensure.  Will discuss with Dr Marva PandaSkulskie, full consult being dictated, will follow closely.  Electronic Signatures: Ashok Cordiaarle, Koston Hennes M (PA-C)  (Signed 18-Jun-14 15:43)  Authored: Brief Consult Note   Last Updated: 18-Jun-14 15:43 by Ashok CordiaEarle, Braydn Carneiro M (PA-C)

## 2015-01-08 NOTE — H&P (Signed)
PATIENT NAME:  Hannah Baker, FURUYA MR#:  [id number] DATE OF BIRTH:  [birth year]  DATE OF ADMISSION:  05/30/2013 REFERRING PHYSICIAN: Dr. Eula Listen .  PRIMARY CARE PHYSICIAN: Doctors at [facility]; Dr. Viviana Simpler currently.  CHIEF COMPLAINT: Status post fall with left hip fracture.  HISTORY OF PRESENT ILLNESS: This is an 81 year old female with a known history of hypothyroidism, advanced dementia, poor ambulatory functional status, ongoing weight loss, protein calorie malnutrition, chronic kidney disease. The patient was admitted for fall, unwitnessed, with left hip pain with finding of left hip fracture. The patient was in the hospital July 2014, where she was admitted for confusion and recurrent falls, where she has seen by palliative care and recommendation was made for Hospice care. The patient was discharged to skilled nursing facility, but she was refusing to participate in occupational therapy and physical therapy, so eventually she was discharged to [facility]. The patient has advanced dementia, as well, currently, she is minimally responsive after receiving IV Dilaudid for her pain, so most of the history was obtained from ED staff and her power of attorney, her grandson Mr. Sandy Salaam, who reports his grandmother did not want to participate in any physical therapy in the past, has been in rapidly deteriorating state of health, with significantly decreased p.o. intake and progressive weight loss. As well, patient is known to have chronic kidney disease at baseline, stage 3 to stage 4. Currently, her creatinine today was 1.53 which is on her baseline. As well, her urinalysis was negative. She was afebrile, and her EKG did not show any significant ST or T wave changes. The patient does not appear to be in any volume overload, no lower extremity edema, no cough, no fever, no productive sputum. Hospitalist service was requested to admit the patient for further medical management and  preoperative evaluation for her left hip fracture. On my examination of the patient, she was minimally responsive even to sternal rub and this was after she received IV Fentanyl and Dilaudid for her pain. She was given 0.4 mg of IV Narcan with some improvement of her mental status where she appears to be more awake and arousable.  PAST MEDICAL HISTORY: 1. Hypothyroidism.  2. Chronic kidney disease, stage 3 to 4.  3. Advanced dementia.  4. Graves' disease.  ALLERGIES: No known drug allergies.  PAST SURGICAL HISTORY: None.  FAMILY HISTORY: As per previous medical records, there is no reported medical issues in the family.  SOCIAL HISTORY: No smoking, alcohol or illicit drug use. Currently lives at [facility] facility.  MEDICATIONS: 1. Aspirin 81 mg oral daily.  2. Morphine 20 mg per mL oral, 5 to 10 mg orally every hour as needed.  3. Tylenol 325 mg 2 tablets every four hours as needed.  4. Lorazepam 0.5 mg oral 1/2 tablet every 4 hours as needed.  5. Citalopram 10 mg oral 2 tablets daily.  6. Risperdal 0.25 mg oral daily.  7. Dulcolax laxative 10 mg suppository daily as needed.  8. Senna 1 tablet 2 times a day.  9. Synthroid 88 mcg oral daily.  Marland Kitchen  REVIEW OF SYSTEMS: Tried to obtain, but unable as patient has advanced dementia  PHYSICAL EXAMINATION: VITAL SIGNS: Temperature 97.9, pulse 98, respiratory rate 19, blood pressure 130/60, saturating 98% on oxygen.  GENERAL: elderly, chronically ill-appearing, cachectic female, lies in bed in no apparent distress.  HEENT: Head atraumatic, normocephalic. Pink conjunctivae. Anicteric sclerae. Dry oral mucosa.  NECK: Supple. No thyromegaly. No JVD. No  carotid bruits.  CHEST: Good air entry bilaterally. No wheezing, rales, rhonchi.  CARDIOVASCULAR: S1, S2 heard. No rubs, murmurs or gallops.  ABDOMEN: Soft, nontender, nondistended. Bowel sounds present.  EXTREMITIES: No edema. No clubbing. No cyanosis. Dorsalis pulse +2 bilaterally.  PSYCHIATRIC:  The patient is lethargic, arousable to verbal stimuli, but not communicative.  IMMUNOLOGICAL: Unable to evaluate, secondary to the patient's mental status and dementia, but she appears to be moving all extremities without gross deficits.  LYMPHATICS: No cervical or supraclavicular lymphadenopathy.  PERTINENT LABORATORY DATA: Glucose 111, BUN 54, creatinine 1.53, sodium 138, potassium 4.4, chloride 107, CO2 26, ALT 21, AST 24, alk phos 93, albumin 3.2. White blood cells 7.1, hemoglobin 9.4, hematocrit 27.8, platelets 246. Urinalysis is negative for leukocyte esterase and nitrite. INR 1.1. EKG showing normal sinus rhythm at 76 beats per minute without significant ST or Q wave changes.  ASSESSMENT AND PLAN: 1. Left hip fracture. The patient presents with unwitnessed fall with left hip fracture. She already has very poor functional status at baseline and mental status secondary to her advanced dementia. The patient does not appear to be in acute congestive heart failure exacerbation or in any volume overload status or any arrhythmias. But, given her overall medical condition, including her chronic kidney disease and cachexia and her poor baseline status, as well, she is considered a moderate risk for surgical procedures. Discussed with her power of attorney, we will consult palliative care to see what the role of care for this patient, especially in the past, she refused to participate in physical therapy and occupational therapy and her poor functional status and poor nutritional status has overall a bad prognostic indicators, but in the same time, she has high mortality rate for untreated hip fracture if it was left without surgical intervention, so, we will consult as well palliative care to discuss the goals of care with this patient.  2. Hypothyroidism. Continue with Synthroid.  3. Chronic kidney disease, appears to be at baseline. Continue with IV fluids.  4. Dementia, advanced; to continue with supportive  care. 5. Deep vein thrombosis prophylaxis: We will have her on sequential compression device and TED hose at this point and will start her on chemical anticoagulation postop.  6. CODE STATUS: The patient is currently DO NOT RESUSCITATE. This was discussed with her power of attorney. As well, she has a DO NOT RESUSCITATE form from the nursing home as well. TOTAL TIME SPENT ON ADMISSION AND PATIENT CARE: 55 minutes. ____________________________ Albertine Patricia, MD dse:nts D: 05/30/2013 00:53:00 ET T: 05/30/2013 01:15:27 ET JOB#: [id number]  cc: Albertine Patricia, MD, <Dictator> Bernarda Erck Graciela Husbands MD ELECTRONICALLY SIGNED 06/14/2013 5:51

## 2015-01-08 NOTE — Consult Note (Signed)
PATIENT NAME:  Hannah Baker, Hannah Baker MR#:  [id number] DATE OF BIRTH:  Nov 22, 1921  DATE OF CONSULTATION:  04/01/2013  REFERRING PHYSICIAN:  Vipul S. Sherryll BurgerShah, MD CONSULTING PHYSICIAN:  Hemang K. Sherryll BurgerShah, MD  REASON FOR CONSULTATION: Altered mental status.   HISTORY OF PRESENT ILLNESS: Hannah Baker is an 81 year old Caucasian female who has been having progressive confusion and has been having frequent falls at her facility that she is staying at for the last 5 days.   The patient also has a chronic history of chronic kidney disease and hypothyroidism, found to have elevated TSH, may be due to inconsistent medication use.   The patient was recently brought to the hospital in June and had MRI of the brain done, which showed right middle cerebral artery ischemic infarct, old, involving the posterior temporal and parietal region.   The patient was refusing any other tests and received morphine to get CT scan of the head before my exam, and the patient was sleepy and drowsy and very hard to arouse.   Most of my details are obtained from the chart review.   The patient also has moderate to advanced dementia requiring care for her ADLs.   She has a previous history of atrial fibrillation, cachexia and diverticulosis. The patient also had MGUS.   Unfortunately, recently the patient had a left wrist fracture after a fall.   PAST MEDICAL HISTORY: Significant for hypothyroidism, chronic kidney disease stage III to IV, and moderate to advanced dementia, Graves' disease status post radioactive ablation, diverticulosis, polyps on colonoscopy, COPD, MGUS, hyperlipidemia, osteoarthritis, osteoporosis.   PAST SURGICAL HISTORY: Significant for appendectomy, D and C, tonsillectomy, left foot surgery x 2.   FAMILY HISTORY: Not available.   SOCIAL HISTORY: Significant in that she is widowed. Her son and daughter both have been deceased. 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   PHYSICAL EXAMINATION: VITAL SIGNS: Her temperature is 99, pulse 69, respiratory rate 20, blood pressure 143/62, pulse oximetry 96% on room air.  GENERAL: She is a very elderly-looking, fragile Caucasian female lying in bed, not in acute distress. She was sleeping and snoring.  NEUROLOGIC: She was not arousable to painful stimuli, but the nurse mentioned that she was given morphine before a recent CT scan of the head because of her agitation and refusing to go for the scan.   Her pupils were reactive. Her neck was supple. Her vestibuloocular reflex was intact.   She had generalized hypertonia.   The patient seemed to have no significant response to stimulation.   I did not give her painful stimuli to check her responses.   Further detailed neurological exam is not possible due to her level of alertness.   RADIOLOGICAL DATA: On her scan, the patient does have a right middle cerebral artery infarct.   ASSESSMENT AND PLAN: 1.  Altered mental status and progressive confusion with frequent falls in a patient with moderate to advanced dementia with recent cachexia and weight loss. Can be coming from her Alzheimer's disease.   At the higher stages of Alzheimer's, patients can have apraxia of the gait, which makes them at high susceptibility to fall, and they have anosognosia where they do not recognize their limitation and they continue to try to walk and fall.   The patient has had frequent falls and if she had a head injury, the patient could have a concussion, which can give multiple symptoms including confusion in elderly  people, and she might take a long time to recover, but I do feel like that this is coming from her underlying pathology.   2.  The patient has previous stroke involving her right middle cerebral artery, posterior temporal and parietal lobe region.   This is an area of mirror neuron system, which is responsible for understanding other persons' nonverbal  body language.   Injury to this location can make conversation very difficult, as the patient can understand verbal output from the opposite person but has a hard time grasping the nonverbal cues, and the patient might seem to be act inappropriately in some situations just because she has not understood the other person's point of view.   The patient is on aspirin for this. I do not think at this stage I would recommend any other secondary stroke prevention strategy.   I believe comfort care is optimal.   I noticed palliative care recommendation of hospice, which I believe is appropriate in this patient, and trying to provide 24/7 care is very necessary to prevent other frequent falls.   I would not recommend any medications for Alzheimer disease at present at this advanced age of dementia.   Feel free to contact me with any further questions. I will follow this patient on an as-needed basis.   ____________________________ Durene Cal. Sherryll Burger, MD hks:jm D: 04/01/2013 20:13:39 ET T: 04/01/2013 21:45:55 ET JOB#: [id number]  cc: Hemang K. Sherryll Burger, MD, <Dictator> Primary Care Physician Durene Cal [facility] MD ELECTRONICALLY SIGNED 04/14/2013 7:40

## 2015-01-08 NOTE — Discharge Summary (Signed)
PATIENT NAME:  Hannah Baker, Hannah Baker MR#:  [id number] DATE OF BIRTH:  1922-07-16  DATE OF ADMISSION:  05/30/2013 DATE OF DISCHARGE:  06/02/2013  For a detailed note, please take a look at the history and physical done on admission by Dr. Randol KernElgergawy.   DISCHARGE DIAGNOSIS: 1.  Status post fall and left hip fracture. 2.  Left hip fracture status post open reduction and internal fixation.  3.  Dementia.  4.  Hypothyroidism.  5.  Depression.  6.  Constipation.   DISCHARGE DIET: The patient is being discharged on a mechanical soft regular diet.   DISCHARGE ACTIVITY: As tolerated.   DISCHARGE FOLLOWUP: Is with the primary care physician at the skilled nursing facility.   DISPOSITION: The patient is being discharged to a skilled nursing facility for ongoing rehab given her recent hip surgery.   DISCHARGE MEDICATIONS:  1.  Synthroid 88 mcg daily. 2.  Lorazepam 0.5 mg 1/2 tab q. 4 hours as needed for anxiety. 3.  Risperdal 0.25 mg at bedtime. 4.  Celexa 10 mg 2 tabs daily. 5.  Aspirin 81 mg daily. 6.  Dulcolax suppository as needed. 7.  Ensure t.i.d. with meals. 8.  Calcium with vitamin D 1 tab b.i.d. 9.  Colace 240 mg daily. 10.  Iron sulfate 325 mg b.i.d. 11.  Senokot 1 tab b.i.d. as needed. 12.  Tylenol 650 q. 4 hours as needed. 13.  Tylenol with hydrocodone 5/325 mg 1 tab q. 4 to 6 hours as needed for pain.  CONSULTANTS DURING THE HOSPITAL COURSE: Dr. Juanell FairlyKevin Krasinski and Dr. Deeann SaintHoward Miller from orthopedics.   PERTINENT STUDIES DONE DURING THE HOSPITAL COURSE: Are as follows: An x-ray of the left hip done showing comminuted intertrochanteric left hip fracture. A chest x-ray done on admission showing COPD without evidence of acute cardiopulmonary disease.   HOSPITAL COURSE: This is an 81 year old female with medical problems as mentioned above who presented to the hospital on 05/30/2013 after suffering a fall and noted to have a left hip fracture.  1.  Status post fall and left hip fracture.  The patient was having significant pain in her left hip. She is not usually very ambulatory to begin with. After further discussion with the patient's POA and also family, it was decided that the patient would benefit from open reduction and internal fixation of the left hip for palliative reasons for pain control. The patient therefore was seen by orthopedics and underwent open reduction and internal fixation on 09/12 after being cleared from a medical standpoint. The patient tolerated the procedure well, although she has not been able to work with physical therapy very well while the hospital, but this was expected given her profound advanced dementia. At this point, the plan is to discharge her to a rehab facility to see if she would benefit from short-term rehab given her recent hip replacement, although if her clinical symptoms continue to deteriorate and she gets worse the next option is to transfer her to hospice home as she has been there before. This plan was discussed with the patient's POA and she is in agreement.  2.  Acute renal failure. This was likely secondary to dehydration and worsening anemia. The patient was given 1 unit of packed red blood cells. Also given some IV fluids. Her creatinine since then has improved and is close to baseline.  3.  Hyperkalemia. This was likely secondary to the acute renal failure. It has since then improved and now resolved.  4.  Dementia with agitation.  The patients mental status has improved. At this point, she will continue her maintenance meds for her dementia including her Risperdal and her Celexa as stated.  5.  Hypothyroidism. The patient was maintained on her Synthroid. She will resume that.  6.  Constipation. This was likely related to the pain meds she has been receiving since her left hip surgery. At this point, the patient has not had a bowel movement, although the patient is getting laxatives and Dulcolax suppository in hoping that she would respond  to it. If she does, she likely will be discharged to a skilled nursing facility later today.   CODE STATUS: She is a DO NOT INTUBATE, DO NOT RESUSCITATE.   TIME SPENT WITH DISCHARGE: 40 minutes.  ____________________________ Rolly PancakeVivek J. Cherlynn KaiserSainani, MD vjs:sb D: 06/02/2013 14:43:06 ET T: 06/02/2013 14:53:22 ET JOB#: [id number]  cc: Rolly PancakeVivek J. Cherlynn KaiserSainani, MD, <Dictator>

## 2015-01-08 NOTE — Consult Note (Signed)
   Comments   Lengthy phone call with pt's niece, Nicole CellaDorothy [phone], [phone]). Updated her on pt's status. She confirms that patient has been declining rapidly over past several months. Family would prefer pt to return to ALF although recognize that she is high risk for recurrent falls, injury, decline, and death. We talked about use of hired sitters vs transition to a higher level of care. Niece plans to contact [facility] and also wants to talk with our Child psychotherapistsocial worker. Family would be agreeable with hospice involvement but this is dependent upon her final disposition.Will order hospice screening. Await PT recommendations. Will follow.    Electronic Signatures for Addendum Section:  Phifer, Harriett SineNancy (MD) (Signed Addendum 16-Jul-14 20:25)  Discussed with Elouise MunroeJosh Nicanor Mendolia, NP, in detail.   Electronic Signatures: Ashonti Leandro, Daryl EasternJoshua R (NP)  (Signed 16-Jul-14 14:48)  Authored: Palliative Care   Last Updated: 16-Jul-14 20:25 by Phifer, Harriett SineNancy (MD)

## 2015-01-08 NOTE — Consult Note (Signed)
Brief Consult Note: Diagnosis: Left comminuted intertrochanteric hip fracture.   Patient was seen by consultant.   Recommend to proceed with surgery or procedure.   Orders entered.   Comments: Patient is an 81 y/o female resident of [facility] who [facility] ED after an unwitnessed fall.  Patient has dementia and is unable to provide a history.  Her family is not at the bedside.  I have reviewed her medical history from the EMR.  On exam, the skin overlying the left hip is intact.   The left lower extremity is shortened and externally rotated.  She has palpable pedal pulses.  Patient is sedated currently and unable to participate with a motor exam and sensation can not be assessed.  Radiographs show a three part, displaced intertrochanteric hip fracture.  Patient is being admitted to the medical service and will have pre-op clearance.  She will be NPO after midnight for placement of intramedullary fixation tomorrow pending medical clearance.  Electronic Signatures: Juanell FairlyKrasinski, Tyneisha Hegeman (MD)  (Signed 12-Sep-14 00:21)  Authored: Brief Consult Note   Last Updated: 12-Sep-14 00:21 by Juanell FairlyKrasinski, Tasman Zapata (MD)

## 2015-01-08 NOTE — Consult Note (Signed)
General Aspect 81 year old female that was admitted with altered mental status with history of thyroid disease and frequent falls.  Patient was also found to be dehydrated and creatinine was elevated.  TSH found to be   10.4, with daily thyroid dose adjusted.  She was found to be in atrial fibrillation but rate controlled. Review of the monitor shows the patient has had paroxysmal A. fib   with intermittent sinus rhythm sinus bradycardia with some sinus arrhythmia.previous Echocardiogram in past has revealed mild mitral valve prolapse, mild MR, s/p  mitral valve repair .   Physical Exam:  GEN no acute distress, thin   HEENT pink conjunctivae, hearing intact to voice, moist oral mucosa   RESP normal resp effort   CARD Irregular rate and rhythm   ABD denies tenderness   EXTR negative edema   SKIN skin turgor decreased   PSYCH alert, poor insight   Review of Systems:  Subjective/Chief Complaint Altered mental status, falls   General: Weight loss or gain  Weakness   Musculoskeletal: weakness with falls   Hematologic: followed by oncology   Review of Systems: All other systems were reviewed and found to be negative   Medications/Allergies Reviewed Medications/Allergies reviewed   Radiology Results: XRay:    16-Jun-14 22:49, Chest PA and Lateral  Chest PA and Lateral   REASON FOR EXAM:    general weakness, weight loss  COMMENTS:       PROCEDURE: DXR - DXR CHEST PA (OR AP) AND LATERAL  - Mar 03 2013 10:49PM     RESULT: The lungs are hyperinflated consistent with COPD but appear   clear. The heart is borderline to mildly enlarged. There is mild ectasia   of the thoracic aorta with atherosclerotic calcification present. There   is no pneumothorax, mass, edema, effusion or underlying infiltrate. The   bones are osteopenic.    IMPRESSION:   1. No acute cardiopulmonary disease. Borderline cardiomegaly.    Dictation Site: 1    Verified By: Elveria RoyalsGEOFFREY H. BROWNE, M.D., MD  US:     17-Jun-14 08:52, US Kidney Bilateral  US Kidney Bilateral   REASON FOR EXAM:    AKI  COMMENTS:       PROCEDURE: US  - US KIDNEY  - Mar 04 2013  8:52AM     RESULT: Comparison: None.    Technique: Multiple grayscale and color Doppler images were obtained of   the kidneys.    Findings:  The right kidney measures8.5 x 3.9 x 4.5 cm. The left kidney measures   6.7 x 3.1 x 3.1 cm. No hydronephrosis. There is a subcentimeter cyst in   the superior pole of the right kidney. There is a 3 mm hyperechoic   shadowing focus in the right kidney which likely represents a renal     calculus. There is a 1.2 cm cyst in the left kidney. A 7 mm calculus is   seen in the left kidney.    There are several hyperechoic foci within the bladder, including layering   dependently within the bladder, which likely represent debris. Ureteral   jets were not seen.    IMPRESSION:   1. No hydronephrosis. Bilateral nephrolithiasis.  2. Hyperechoic foci within the bladder may represent debris. Correlate   with urinalysis.    Dictation site: 2      Verified By: Lewie ChamberOBERT L. SUBER, M.D.,MD    No Known Allergies:   Vital Signs/Nurse's Notes: **Vital Signs.:   17-Jun-14 05:32  Vital Signs  Type Routine  Temperature Temperature (F) 97.8  Celsius 36.5  Temperature Source oral  Pulse Pulse 63  Respirations Respirations 18  Systolic BP Systolic BP 104  Diastolic BP (mmHg) Diastolic BP (mmHg) 59  Mean BP 74  Pulse Ox % Pulse Ox % 91  Pulse Ox Activity Level  At rest  Oxygen Delivery Room Air/ 21 %    Impression 81 year old female admitted with altered mental status, status post mitral valve repair, history of frequent falls with new onset paroxysmal A. fib with intermittent sinus bradycardia, sinus arrhythmia.   Plan 1.  Will continue to monitor patient but right now do not see any need for rate control since A. fib was controlled and also has possibly findings of early sick sinus syndrome with sinus  bradycardia, sinus arrhythmia.  Rate control medication may  lead to  hypotension, bradycardia, dizziness and increased  falls. 2.  Patient is not a candidate for any further anticoagulation at this time , other than a baby aspirin , due to mental status changes, advanced stage and falls. 3.  Do not see  need for any  further cardiac intervention at this time.    Patient was seen in collaboration with Dr. Gwen Pounds.   Electronic Signatures: Rudi Coco (NP)  (Signed 17-Jun-14 11:43)  Authored: General Aspect/Present Illness, History and Physical Exam, Review of System, Radiology, Allergies, Vital Signs/Nurse's Notes, Impression/Plan   Last Updated: 17-Jun-14 11:43 by Rudi Coco (NP)

## 2015-01-08 NOTE — Consult Note (Signed)
History of Present Illness:  Reason for Consult Anemia, history of MGUS.   HPI   Patient has not been seen in the [facility] since March of 2013.  She was brought to the emergency room yesterday by her grandson with altered metal status.  Patient states she was in her usual state of health and active up until several months ago.   Since that time, she reports an approximately 20 pound weight loss, poor appetite, and increasing weakness and fatigue.  Her grandson also reported several prior episodes of confusion and altered mental status.  She has had no recent fevers or illnesses.   She has no chest pain or shortness of breath.  She denies any nausea, vomiting, constipation, or diarrhea.  Has no urinary complaints.  Patient offers no further specific complaints today.  PFSH:  Additional Past Medical and Surgical History Hypothyroidism, hyperlipidemia, mitral regurgitation, allergic rhinitis, osteoarthritis, osteoporosis.  Appendectomy, D&C, tonsillectomy, left foot surgery x2.  Family history: Negative and noncontributory.  Social history: Patient denies tobacco or alcohol.   Review of Systems:  Performance Status (ECOG) 2   Review of Systems   As per HPI. Otherwise, 10 point system review was negative.   NURSING NOTES: **Vital Signs.:   17-Jun-14 05:32   Vital Signs Type: Routine   Temperature Temperature (F): 97.8   Celsius: 36.5   Temperature Source: oral   Pulse Pulse: 63   Respirations Respirations: 18   Systolic BP Systolic BP: 480   Diastolic BP (mmHg) Diastolic BP (mmHg): 59   Mean BP: 74   Pulse Ox % Pulse Ox %: 91   Pulse Ox Activity Level: At rest   Oxygen Delivery: Room Air/ 21 %   Physical Exam:  Physical Exam General: Thin, no acute distress. Eyes: Anicteric sclera. Lungs: Clear to auscultation bilaterally. Heart: irregular Abdomen: Soft, normoactive bowel sounds. Musculoskeletal: No edema, cyanosis, or clubbing. Neuro: Alert, answering all  questions appropriately. Cranial nerves grossly intact. Skin: No rashes or petechiae noted. Psych: Normal affect.    No Known Allergies:     Synthroid 75 mcg (0.075 mg) oral tablet: 1 tab(s) orally once a day x 30 days, Status: Active, Quantity: 30, Refills: 5   Artificial Tears:   every 4 hours , Status: Active, Quantity: 0, Refills: None   ferrous sulfate enteric coated tablet 325 mg: 1 tab(s) orally once a day x 30 days , Status: Active, Quantity: 30, Refills: None   aspirin tablet, disintegrating 81 mg: 1 tab(s) orally once a day x 30 days , Status: Active, Quantity: 30, Refills: None   Multivitamin 1 daily:    , Status: Active, Quantity: 0, Refills: None   Calcium tablet 1 daily:    , Status: Active, Quantity: 0, Refills: None  Laboratory Results: Thyroid:  17-Jun-14 04:40   Thyroid Stimulating Hormone  6.46 (0.45-4.50 (International Unit)  ----------------------- Pregnant patients have  different reference  ranges for TSH:  - - - - - - - - - -  Pregnant, first trimetser:  0.36 - 2.50 uIU/mL)  Routine Chem:  17-Jun-14 04:40   Glucose, Serum 81  BUN  31  Creatinine (comp)  2.42  Sodium, Serum 143  Potassium, Serum 4.7  Chloride, Serum  113  CO2, Serum 26  Calcium (Total), Serum 8.5  Anion Gap  4  Osmolality (calc) 291  eGFR (African American)  20  eGFR (Non-African American)  17 (eGFR values <70m/min/1.73 m2 may be an indication of chronic kidney disease (CKD). Calculated eGFR  is useful in patients with stable renal function. The eGFR calculation will not be reliable in acutely ill patients when serum creatinine is changing rapidly. It is not useful in  patients on dialysis. The eGFR calculation may not be applicable to patients at the low and high extremes of body sizes, pregnant women, and vegetarians.)  Magnesium, Serum 1.9 (1.8-2.4 THERAPEUTIC RANGE: 4-7 mg/dL TOXIC: > 10 mg/dL  -----------------------)  Cardiac:  17-Jun-14 04:40   CK, Total 99   CPK-MB, Serum 1.2 (Result(s) reported on 04 Mar 2013 at 05:06AM.)  Troponin I < 0.02 (0.00-0.05 0.05 ng/mL or less: NEGATIVE  Repeat testing in 3-6 hrs  if clinically indicated. >0.05 ng/mL: POTENTIAL  MYOCARDIAL INJURY. Repeat  testing in 3-6 hrs if  clinically indicated. NOTE: An increase or decrease  of 30% or more on serial  testing suggests a  clinically important change)  Routine Hem:  17-Jun-14 04:40   WBC (CBC) 6.0  RBC (CBC)  2.79  Hemoglobin (CBC)  8.8  Hematocrit (CBC)  26.0  Platelet Count (CBC) 223  MCV 93  MCH 31.5  MCHC 33.8  RDW 13.8  Neutrophil % 56.8  Lymphocyte % 21.4  Monocyte % 11.1  Eosinophil % 8.9  Basophil % 1.8  Neutrophil # 3.4  Lymphocyte # 1.3  Monocyte # 0.7  Eosinophil # 0.5  Basophil # 0.1 (Result(s) reported on 04 Mar 2013 at 04:57AM.)   Assessment and Plan: Impression:   Anemia and MGUS. Plan:   1.  Anemia: patient's hemoglobin is slightly below her baseline.   Will get iron stores, B12, and folate for completeness.  Will also assess for underlying hemolysis.  Given her chronic renal insufficiency, patient may also benefit from Procrit in the future. MGUS: No further intervention needed.  If patient progresses to mulitiple myeloma, she likely would not tolerate treatment given her advanced age    consult, will follow.  Electronic Signatures: Delight Hoh (MD)  (Signed 17-Jun-14 14:44)  Authored: HISTORY OF PRESENT ILLNESS, PFSH, ROS, NURSING NOTES, PE, ALLERGIES, HOME MEDICATIONS, LABS, ASSESSMENT AND PLAN   Last Updated: 17-Jun-14 14:44 by Delight Hoh (MD)

## 2015-01-08 NOTE — Discharge Summary (Signed)
PATIENT NAME:  Hannah Baker, Hannah Baker MR#:  [id number] DATE OF BIRTH:  06-02-1922  DATE OF ADMISSION:  04/01/2013 DATE OF DISCHARGE:  04/04/2013  HOSPITAL COURSE:  An 81 year old female patient with history of hypothyroidism, CKD, admitted for altered mental status and some confusion.  The patient is from [facility] and the patient brought in because she hit her head on the floor and brought to the Emergency Room.  The patient admitted to the hospitalist service for metabolic encephalopathy secondary to hypothyroidism and also underlying dementia.  The patient was started on IV fluids and her thyroid medicines were restarted.  The patient has severe cachexia and severe malnutrition, poor by mouth intake.  The patient's lab data on admission showed lipase slightly up at 558, but repeat lipase came back normal.  CBC: White count is normal.  BUN 37, creatinine 1.7 on admission and chest x-ray showed clear.  Blood cultures have been negative.  Ammonia less than 0.25.  UA is clear and the patient's lipase came back to 263 with normal LFTs with albumin of 2.9.  The patient was seen by palliative care, Dr. Harvie JuniorPhifer, as well.  The patient's x-ray showed only COPD changes and some pleural effusion in the costophrenic angles.  The patient was seen by Dr. Harvie JuniorPhifer and Beverely LowJeannie from hospice.  The patient's family decided to take her to hospice home. The patient has severe dementia with  FAST  score of 6 , cardiomyopathy with EF 45% to 50% and  CKD stage 4 with cachexia and weight loss, severe dementia, not eating well with malnutrition.  The patient also with wrist fracture because of a fall and she was seen in the Emergency Room on July 12th.  The patient's family decided to take her to hospice home and the patient is discharged to hospice home due to her severe dementia and poor quality of life ,she was  refered  to hospice. , started on Roxanol and Ativan and discussed with the family.   Time spent on discharge preparation more than  30 minutes.   Follow up with patient's primary doctor as well.    ____________________________ Katha HammingSnehalatha Emitt Maglione, MD sk:ea D: 04/05/2013 20:02:51 ET T: 04/06/2013 06:13:49 ET JOB#: [id number]  cc: Katha HammingSnehalatha Tanecia Mccay, MD, <Dictator> Katha HammingSNEHALATHA Martyna Thorns MD ELECTRONICALLY SIGNED 04/14/2013 18:55

## 2015-01-08 NOTE — H&P (Signed)
PATIENT NAME:  Hannah Baker, Hannah Baker MR#:  [id number] DATE OF BIRTH:  [birth year]  DATE OF ADMISSION:  04/01/2013  PRIMARY CARE PHYSICIAN:  [facility]/Dr. Lorie Phenix   REQUESTING PHYSICIAN:  Dr. Sharyn Creamer  CHIEF COMPLAINT: Confusion and recurrent fall.  HISTORY OF PRESENT ILLNESS: The patient is an 81 year old female with a known history of hypothyroidism, ongoing weight loss and CKD is being admitted for worsening confusion, and recurrent fall. The patient was sent from the facility in the [facility] assisted living and she was found to be more confused. She fell in the bathroom and hit her head on the floor/wall this morning. She was brought into the Emergency Department for further evaluation and she was increasingly confused and agitated. In the ED, she refused getting CT scan of the head and was also not too sure about getting admitted, but she agreed eventually to come into the hospital. In the ED, she was found to have TSH of 17, and her mentation was okay, did not seem to be confused, although she kept stating that she is at home and does not live at any kind of facility.  She denies any symptoms at this time, although she did admit that she has been falling at least 2 to 3 times in last 6 months and she feels that getting up quickly might be contributing to her fall, as she does feel very dizzy when she gets up too quick. She denies any headache, blurry vision or any other symptoms at this time.   PAST MEDICAL HISTORY:  1. Hypothyroidism.  2. CKD stage III to IV.  3. Dementia.  4.   Graves  disease.   ALLERGIES:  No known drug allergies.   PAST SURGICAL HISTORY: None.   FAMILY HISTORY: No reported medical issues in the family.   SOCIAL HISTORY: No smoking, alcohol or illicit drug use. Son lives close by and takes care of her daily needs.   MEDICATIONS AT HOME: 1. Aspirin 81 mg p.o. daily.  2. Dronabinol 2.5 mg p.o. b.i.d.  3. Lexapro 5 mg p.o. daily.   4. Ranitidine 150 mg p.o. b.i.d.  5. Remeron 15 mg p.o. at bedtime.  6. Synthroid 88 mcg p.o. daily.  7. Tylenol 650 mg p.o. q. 4 hours needed.   REVIEW OF SYSTEMS:   CONSTITUTIONAL: No fever or weakness.   EYES: No blurred or double vision.   ENT: No tinnitus or ear pain.   RESPIRATORY: No cough, wheezing, hemoptysis.   CARDIOVASCULAR: Chest pain, orthopnea, edema.   GASTROINTESTINAL: No nausea, vomiting, diarrhea. Poor oral nutrition.   GENITOURINARY: No dysuria or hematuria.   ENDOCRINE: No polyuria or nocturia. Positive for hypothyroidism.   HEMATOLOGY: No anemia or easy bruising.   SKIN: No rash or lesion.   MUSCULOSKELETAL: No arthritis or muscle cramp.   NEUROLOGIC: No tingling, numbness, weakness.   Worsening confusion and recurrent fall.    PSYCHIATRY: No history of anxiety or depression.   PHYSICAL EXAMINATION:  VITAL SIGNS: Temperature 98, heart rate 62 per minute, respirations 20 per minute, blood pressure 123/56, she is saturating 95%.  GENERAL:  The patient is an 81 year old, white, cachectic-looking female lying in the bed comfortably without any acute distress.    EYES:  Equal, round and reactive to light and accommodation. No scleral icterus. Extraocular muscles intact.  HEENT:  Head atraumatic, normocephalic. Oropharynx and nasopharynx clear.    NECK:  Supple.  No JVD, No Thyroid Enlargement or Tenderness.  LUNGS: Clear to auscultation bilaterally. No wheezing, rales, rhonchi or crepitation.   CARDIOVASCULAR: S1, S2 normal. Systolic ejection murmur present.   ABDOMEN: Soft, nontender, nondistended. Bowel sounds present. No organomegaly or masses.   EXTREMITIES: No pedal edema, cyanosis or clubbing.   NEUROLOGIC: The patient seems awake and alert, oriented to time, place, and person. She was somewhat slow in responding to verbal commands. I am not sure that she has some difficulty hearing, but otherwise muscle strength 5/5 in all  extremities. Sensation seems intact. Gait not checked.   MUSCULOSKELETAL: No joint effusion, tenderness or edema.   SKIN: No obvious rash, lesion or ulcer. She does have some thinner muscles hypotrophy and very visible tendons, signs of severe malnutrition.   LABORATORY, DIAGNOSTIC AND RADIOLOGIC DATA: Normal CBC except hemoglobin of 11.2, hematocrit 33.9. Urinalysis was negative. Normal first set of cardiac enzymes. Thyroid function showed TSH of 17.  Free T4 of 0.97. LFTs showed alkaline phosphatase of 169, AST of 42.  Normal BMP except BUN of 37, creatinine 1.71.  Lipase of 558.   Chest x-ray in the ED showed chronic obstructive pulmonary disease, otherwise stable appearance.   IMPRESSION AND PLAN: 1. Metabolic encephalopathy likely multifactorial per report. The patient is getting increasingly confused.  We will obtain a neurology consultation as the patient is refusing any kind of scans at this point, we will hold off that and await neurology opinion.  This could be from hypothyroidism with a TSH 17. We will increase the dose of Synthroid 200 mcg at this time, although this could be just her not getting her scheduled dose of Synthroid.  2. Recurrent falls, likely due to ambulatory difficulty, possible orthostasis. We will check orthostatic vitals. We will get physical and occupational therapy evaluation and management.  3. Hypothyroidism with TSH of 17, increase her dose 200 mcg 188 mcg likely due to noncompliance and not getting her scheduled dose. 4. Chronic kidney disease stage IV with a baseline creatinine of 1.6 to 1.7, close to her baseline.  5. Cachexia with severe protein calorie malnutrition, likely from chronic poor p.o. nutrition.   CODE STATUS: DO NOT RESUSCITATE. Will consult palliative care.   TOTAL TIME TAKING CARE OF THIS PATIENT: 55 minutes.   His [place] history and physical for patient's elevated Dr. Harriett SineNancy Maloney/[facility]     ____________________________ Ellamae SiaVipul S. Sherryll BurgerShah, MD vss:rw D: 04/01/2013 15:13:00 ET T: 04/01/2013 16:28:09 ET JOB#: [id number]  cc: Lacretia Tindall S. Sherryll BurgerShah, MD, <Dictator> Leo GrosserNancy J. Maloney, MD [facility] Ellamae SiaVIPUL [facility] MD ELECTRONICALLY SIGNED 04/07/2013 8:47
